# Patient Record
Sex: Female | Born: 1990 | Race: Black or African American | Hispanic: No | Marital: Single | Smoking: Former smoker
Health system: Southern US, Community
[De-identification: ages and names within clinical notes are randomized; demographics above are authoritative.]

## PROBLEM LIST (undated history)

## (undated) DIAGNOSIS — Z789 Other specified health status: Secondary | ICD-10-CM

## (undated) DIAGNOSIS — IMO0002 Reserved for concepts with insufficient information to code with codable children: Secondary | ICD-10-CM

## (undated) DIAGNOSIS — R87619 Unspecified abnormal cytological findings in specimens from cervix uteri: Secondary | ICD-10-CM

## (undated) DIAGNOSIS — N390 Urinary tract infection, site not specified: Secondary | ICD-10-CM

## (undated) HISTORY — DX: Urinary tract infection, site not specified: N39.0

## (undated) HISTORY — DX: Reserved for concepts with insufficient information to code with codable children: IMO0002

## (undated) HISTORY — DX: Unspecified abnormal cytological findings in specimens from cervix uteri: R87.619

## (undated) HISTORY — PX: DENTAL SURGERY: SHX609

---
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
## 2010-02-11 ENCOUNTER — Emergency Department (HOSPITAL_COMMUNITY): Admission: EM | Admit: 2010-02-11 | Discharge: 2010-02-11 | Payer: Self-pay | Admitting: Emergency Medicine

## 2010-08-21 ENCOUNTER — Emergency Department (HOSPITAL_COMMUNITY)
Admission: EM | Admit: 2010-08-21 | Discharge: 2010-08-21 | Payer: Self-pay | Source: Home / Self Care | Admitting: Family Medicine

## 2010-08-23 ENCOUNTER — Emergency Department (HOSPITAL_COMMUNITY)
Admission: EM | Admit: 2010-08-23 | Discharge: 2010-08-23 | Payer: Self-pay | Source: Home / Self Care | Admitting: Family Medicine

## 2010-08-24 LAB — CULTURE, ROUTINE-ABSCESS

## 2010-09-28 LAB — GC/CHLAMYDIA PROBE AMP, GENITAL
Chlamydia: NEGATIVE
Gonorrhea: NEGATIVE

## 2010-09-28 LAB — ABO/RH

## 2010-10-14 LAB — URINE MICROSCOPIC-ADD ON

## 2010-10-14 LAB — URINALYSIS, ROUTINE W REFLEX MICROSCOPIC
Bilirubin Urine: NEGATIVE
Glucose, UA: NEGATIVE mg/dL
Ketones, ur: NEGATIVE mg/dL
Nitrite: NEGATIVE
Protein, ur: 30 mg/dL — AB
Specific Gravity, Urine: 1.022 (ref 1.005–1.030)
Urobilinogen, UA: 1 mg/dL (ref 0.0–1.0)
pH: 6.5 (ref 5.0–8.0)

## 2010-10-14 LAB — PREGNANCY, URINE: Preg Test, Ur: NEGATIVE

## 2010-10-21 ENCOUNTER — Emergency Department (HOSPITAL_COMMUNITY)
Admission: EM | Admit: 2010-10-21 | Discharge: 2010-10-21 | Disposition: A | Discharge status: Home / Self Care | Payer: Medicaid Other | Attending: Emergency Medicine | Admitting: Emergency Medicine

## 2010-10-21 DIAGNOSIS — O9989 Other specified diseases and conditions complicating pregnancy, childbirth and the puerperium: Secondary | ICD-10-CM | POA: Insufficient documentation

## 2010-10-21 DIAGNOSIS — T7840XA Allergy, unspecified, initial encounter: Secondary | ICD-10-CM | POA: Insufficient documentation

## 2010-10-21 DIAGNOSIS — O99891 Other specified diseases and conditions complicating pregnancy: Secondary | ICD-10-CM | POA: Insufficient documentation

## 2010-10-21 DIAGNOSIS — I10 Essential (primary) hypertension: Secondary | ICD-10-CM | POA: Insufficient documentation

## 2010-10-21 DIAGNOSIS — L299 Pruritus, unspecified: Secondary | ICD-10-CM | POA: Insufficient documentation

## 2011-02-22 LAB — STREP B DNA PROBE: GBS: NEGATIVE

## 2011-03-15 ENCOUNTER — Inpatient Hospital Stay (HOSPITAL_COMMUNITY)
Admission: AD | Admit: 2011-03-15 | Discharge: 2011-03-16 | Disposition: A | Discharge status: Home / Self Care | Payer: Medicaid Other | Source: Ambulatory Visit | Attending: Obstetrics and Gynecology | Admitting: Obstetrics and Gynecology

## 2011-03-15 DIAGNOSIS — R109 Unspecified abdominal pain: Secondary | ICD-10-CM | POA: Insufficient documentation

## 2011-03-15 DIAGNOSIS — O99891 Other specified diseases and conditions complicating pregnancy: Secondary | ICD-10-CM | POA: Insufficient documentation

## 2011-03-15 HISTORY — DX: Other specified health status: Z78.9

## 2011-03-16 ENCOUNTER — Encounter (HOSPITAL_COMMUNITY): Payer: Self-pay | Admitting: *Deleted

## 2011-03-16 LAB — URINALYSIS, ROUTINE W REFLEX MICROSCOPIC
Bilirubin Urine: NEGATIVE
Glucose, UA: NEGATIVE mg/dL
Ketones, ur: NEGATIVE mg/dL
Leukocytes, UA: NEGATIVE
pH: 6 (ref 5.0–8.0)

## 2011-03-16 NOTE — Progress Notes (Signed)
Pt presents to [facility] for c/o pain on left side that started after she left the md office around 430pm yesterday evening.  States she was 1cm/60. Denies leaking or bleeding.

## 2011-03-16 NOTE — Progress Notes (Signed)
Dr. Ambrose Mantle notified of pt presenting for c/o abdominal pain mostly on left side.  Notified of pt placed on monitor and has a reactive strip with irregular contractions.  No cervical change since checked by md yesterday. Ok to [employer] home.

## 2011-03-16 NOTE — Progress Notes (Signed)
WAS  AT DR OFFICE - NL VISIT- R SIDE WAS HURTING- THEN BACK  AND NOW  L SIDE HURTS.  CALLED DR HENLEY.  DENIES HSV AND MRSA. DOES NOT  HURT TO VOID. LAST SEX- LAST WEEK.

## 2011-03-23 ENCOUNTER — Encounter (HOSPITAL_COMMUNITY): Payer: Self-pay | Admitting: *Deleted

## 2011-03-23 ENCOUNTER — Telehealth (HOSPITAL_COMMUNITY): Payer: Self-pay | Admitting: *Deleted

## 2011-03-23 NOTE — Telephone Encounter (Signed)
Preadmission screen  

## 2011-03-27 ENCOUNTER — Encounter (HOSPITAL_COMMUNITY): Payer: Self-pay

## 2011-03-27 ENCOUNTER — Inpatient Hospital Stay (HOSPITAL_COMMUNITY)
Admission: RE | Admit: 2011-03-27 | Discharge: 2011-03-29 | DRG: 775 | Disposition: A | Discharge status: Home / Self Care | Payer: Medicaid Other | Source: Ambulatory Visit | Attending: Obstetrics and Gynecology | Admitting: Obstetrics and Gynecology

## 2011-03-27 ENCOUNTER — Inpatient Hospital Stay (HOSPITAL_COMMUNITY): Payer: Medicaid Other | Admitting: Anesthesiology

## 2011-03-27 ENCOUNTER — Encounter (HOSPITAL_COMMUNITY): Payer: Self-pay | Admitting: Anesthesiology

## 2011-03-27 LAB — CBC
HCT: 33.9 % — ABNORMAL LOW (ref 36.0–46.0)
Hemoglobin: 11.2 g/dL — ABNORMAL LOW (ref 12.0–15.0)
MCH: 32.1 pg (ref 26.0–34.0)
MCHC: 33 g/dL (ref 30.0–36.0)
RDW: 13 % (ref 11.5–15.5)

## 2011-03-27 LAB — ABO/RH: ABO/RH(D): O POS

## 2011-03-27 LAB — RPR: RPR Ser Ql: NONREACTIVE

## 2011-03-27 MED ORDER — LACTATED RINGERS IV SOLN: 500.0000 mL | INTRAVENOUS | Status: DC | PRN

## 2011-03-27 MED ORDER — BENZOCAINE-MENTHOL 20-0.5 % EX AERO
1.0000 "application " | INHALATION_SPRAY | CUTANEOUS | Status: DC | PRN
  Administered 2011-03-27: 1 via TOPICAL

## 2011-03-27 MED ORDER — ONDANSETRON HCL 4 MG PO TABS: 4.0000 mg | ORAL_TABLET | ORAL | Status: DC | PRN

## 2011-03-27 MED ORDER — FENTANYL 2.5 MCG/ML BUPIVACAINE 1/10 % EPIDURAL INFUSION (WH - ANES)
14.0000 mL/h | INTRAMUSCULAR | Status: DC
  Administered 2011-03-27: 14 mL/h via EPIDURAL
  Filled 2011-03-27: qty 60

## 2011-03-27 MED ORDER — OXYCODONE-ACETAMINOPHEN 5-325 MG PO TABS
1.0000 | ORAL_TABLET | ORAL | Status: DC | PRN
  Administered 2011-03-27 – 2011-03-29 (×4): 1 via ORAL
  Filled 2011-03-27 (×4): qty 1

## 2011-03-27 MED ORDER — OXYCODONE-ACETAMINOPHEN 5-325 MG PO TABS: 2.0000 | ORAL_TABLET | ORAL | Status: DC | PRN

## 2011-03-27 MED ORDER — DIPHENHYDRAMINE HCL 50 MG/ML IJ SOLN: 12.5000 mg | INTRAMUSCULAR | Status: DC | PRN

## 2011-03-27 MED ORDER — EPHEDRINE 5 MG/ML INJ: 10.0000 mg | INTRAVENOUS | Status: DC | PRN

## 2011-03-27 MED ORDER — LACTATED RINGERS IV SOLN
500.0000 mL | Freq: Once | INTRAVENOUS | Status: AC
  Administered 2011-03-27: 1000 mL via INTRAVENOUS

## 2011-03-27 MED ORDER — PHENYLEPHRINE 40 MCG/ML (10ML) SYRINGE FOR IV PUSH (FOR BLOOD PRESSURE SUPPORT)
80.0000 ug | PREFILLED_SYRINGE | INTRAVENOUS | Status: DC | PRN
  Filled 2011-03-27: qty 5

## 2011-03-27 MED ORDER — OXYTOCIN 20 UNITS IN LACTATED RINGERS INFUSION - SIMPLE
125.0000 mL/h | INTRAVENOUS | Status: AC
  Filled 2011-03-27: qty 1000

## 2011-03-27 MED ORDER — PHENYLEPHRINE 40 MCG/ML (10ML) SYRINGE FOR IV PUSH (FOR BLOOD PRESSURE SUPPORT): 80.0000 ug | PREFILLED_SYRINGE | INTRAVENOUS | Status: DC | PRN

## 2011-03-27 MED ORDER — IBUPROFEN 600 MG PO TABS
600.0000 mg | ORAL_TABLET | Freq: Four times a day (QID) | ORAL | Status: DC
  Administered 2011-03-27 – 2011-03-29 (×7): 600 mg via ORAL
  Filled 2011-03-27 (×7): qty 1

## 2011-03-27 MED ORDER — CLINDAMYCIN PHOSPHATE 900 MG/50ML IV SOLN: 900.0000 mg | Freq: Three times a day (TID) | INTRAVENOUS | Status: DC

## 2011-03-27 MED ORDER — ONDANSETRON HCL 4 MG/2ML IJ SOLN: 4.0000 mg | INTRAMUSCULAR | Status: DC | PRN

## 2011-03-27 MED ORDER — MEASLES, MUMPS & RUBELLA VAC ~~LOC~~ INJ
0.5000 mL | INJECTION | Freq: Once | SUBCUTANEOUS | Status: DC
  Filled 2011-03-27: qty 0.5

## 2011-03-27 MED ORDER — DIBUCAINE 1 % RE OINT: 1.0000 "application " | TOPICAL_OINTMENT | RECTAL | Status: DC | PRN

## 2011-03-27 MED ORDER — DIPHENHYDRAMINE HCL 25 MG PO CAPS: 25.0000 mg | ORAL_CAPSULE | Freq: Four times a day (QID) | ORAL | Status: DC | PRN

## 2011-03-27 MED ORDER — TETANUS-DIPHTH-ACELL PERTUSSIS 5-2.5-18.5 LF-MCG/0.5 IM SUSP
0.5000 mL | Freq: Once | INTRAMUSCULAR | Status: AC
  Administered 2011-03-28: 0.5 mL via INTRAMUSCULAR
  Filled 2011-03-27: qty 0.5

## 2011-03-27 MED ORDER — OXYTOCIN 20 UNITS IN LACTATED RINGERS INFUSION - SIMPLE
1.0000 m[IU]/min | INTRAVENOUS | Status: DC
  Administered 2011-03-27: 333 m[IU]/min via INTRAVENOUS
  Administered 2011-03-27: 1 m[IU]/min via INTRAVENOUS

## 2011-03-27 MED ORDER — LIDOCAINE HCL 1.5 % IJ SOLN
INTRAMUSCULAR | Status: DC | PRN
  Administered 2011-03-27 (×2): 5 mL via EPIDURAL
  Administered 2011-03-27: 2 mL via EPIDURAL

## 2011-03-27 MED ORDER — LACTATED RINGERS IV SOLN: INTRAVENOUS | Status: DC

## 2011-03-27 MED ORDER — ZOLPIDEM TARTRATE 5 MG PO TABS: 5.0000 mg | ORAL_TABLET | Freq: Every evening | ORAL | Status: DC | PRN

## 2011-03-27 MED ORDER — OXYTOCIN BOLUS FROM INFUSION
500.0000 mL | Freq: Once | INTRAVENOUS | Status: DC
  Filled 2011-03-27: qty 500
  Filled 2011-03-27: qty 1000

## 2011-03-27 MED ORDER — IBUPROFEN 600 MG PO TABS: 600.0000 mg | ORAL_TABLET | Freq: Four times a day (QID) | ORAL | Status: DC | PRN

## 2011-03-27 MED ORDER — PENICILLIN G POTASSIUM 5000000 UNITS IJ SOLR: 2.5000 10*6.[IU] | INTRAVENOUS | Status: DC

## 2011-03-27 MED ORDER — PENICILLIN G POTASSIUM 5000000 UNITS IJ SOLR: 5.0000 10*6.[IU] | Freq: Once | INTRAVENOUS | Status: DC

## 2011-03-27 MED ORDER — LANOLIN HYDROUS EX OINT: TOPICAL_OINTMENT | CUTANEOUS | Status: DC | PRN

## 2011-03-27 MED ORDER — SENNOSIDES-DOCUSATE SODIUM 8.6-50 MG PO TABS
2.0000 | ORAL_TABLET | Freq: Every day | ORAL | Status: DC
  Administered 2011-03-27 – 2011-03-29 (×2): 2 via ORAL

## 2011-03-27 MED ORDER — PRENATAL PLUS 27-1 MG PO TABS
1.0000 | ORAL_TABLET | Freq: Every day | ORAL | Status: DC
  Administered 2011-03-28: 1 via ORAL
  Filled 2011-03-27: qty 1

## 2011-03-27 MED ORDER — SIMETHICONE 80 MG PO CHEW: 80.0000 mg | CHEWABLE_TABLET | ORAL | Status: DC | PRN

## 2011-03-27 MED ORDER — EPHEDRINE 5 MG/ML INJ
10.0000 mg | INTRAVENOUS | Status: DC | PRN
  Filled 2011-03-27: qty 4

## 2011-03-27 MED ORDER — BENZOCAINE-MENTHOL 20-0.5 % EX AERO
INHALATION_SPRAY | CUTANEOUS | Status: AC
  Administered 2011-03-27: 1 via TOPICAL
  Filled 2011-03-27: qty 56

## 2011-03-27 MED ORDER — LIDOCAINE HCL (PF) 1 % IJ SOLN
30.0000 mL | INTRAMUSCULAR | Status: DC | PRN
  Filled 2011-03-27: qty 30

## 2011-03-27 MED ORDER — WITCH HAZEL-GLYCERIN EX PADS: 1.0000 "application " | MEDICATED_PAD | CUTANEOUS | Status: DC | PRN

## 2011-03-27 NOTE — Op Note (Signed)
NAMEINESSA, WARDROP             ACCOUNT NO.:  [id number]  MEDICAL RECORD NO.:  [id number]  LOCATION:  9121                          FACILITY:  [facility]  PHYSICIAN:  Malachi Pro. Ambrose Mantle, M.D. DATE OF BIRTH:  Jun 05, 1991  DATE OF PROCEDURE:  03/27/2011 DATE OF DISCHARGE:                              OPERATIVE REPORT   This patient reached full dilatation and was asked to begin pushing. She was placed in lithotomy position in the stirrups and gradually brought the vertex to the perineum in the ROT position.  It gradually rotated to ROA, and she delivered spontaneously ROA over an intact perineum a living 6 pounds 4 ounces female infant without Apgars of 8 at 1 and 9 at 5 minutes.  There was one loop of nuchal cord that was loose. There was a mild amount of shoulder dystocia that I managed by putting the patient's legs in McRoberts position grasping under the axilla. After the delivery, the cord was clamped, and the infant was placed in the warmer after placing the baby on the mother's abdomen.  The placenta was removed intact.  The inside of the uterus was inspected and found to be free of any remaining products of conception.  The uterus contracted well with the aid of massage and Pitocin.  There was one small bleeder at the introitus at 10 o'clock and this was sutured with a figure-of- eight suture of 3-0 Vicryl with good control of bleeding there were no complications.  Blood loss was estimated about 400 mL.  The patient was allowed to bond with her baby.     Malachi Pro. Ambrose Mantle, M.D.     TFH/MEDQ  D:  03/27/2011  T:  03/27/2011  Job:  [id number]

## 2011-03-27 NOTE — Progress Notes (Signed)
Dr. Ambrose Mantle notified of pt status, SVE, FHR, UC pattern, pitocin amount, and pt's epidural request.  Orders received for an epidural, will continue to monitor.

## 2011-03-27 NOTE — Progress Notes (Signed)
Dr. Ambrose Mantle at bedside for delivery and reviewing FHR tracing.

## 2011-03-27 NOTE — Anesthesia Procedure Notes (Signed)
Epidural Patient location during procedure: OB Start time: 03/27/2011 12:12 PM Reason for block: procedure for pain  Staffing Performed by: anesthesiologist   Preanesthetic Checklist Completed: patient identified, site marked, surgical consent, pre-op evaluation, timeout performed, IV checked, risks and benefits discussed and monitors and equipment checked  Epidural Patient position: sitting Prep: site prepped and draped and DuraPrep Patient monitoring: continuous pulse ox and blood pressure Approach: midline Injection technique: LOR air  Needle:  Needle type: Tuohy  Needle gauge: 17 G Needle length: 9 cm Needle insertion depth: 5 cm cm Catheter type: closed end flexible Catheter size: 19 Gauge Catheter at skin depth: 10 cm Test dose: negative  Assessment Events: blood not aspirated, injection not painful, no injection resistance, negative IV test and no paresthesia  Additional Notes Discussed risk of headache, infection, bleeding, nerve injury and failed or incomplete block.  Patient voices understanding and wishes to proceed.

## 2011-03-27 NOTE — H&P (Signed)
[name], [name]             ACCOUNT NO.:  [id number]  MEDICAL RECORD NO.:  [id number]  LOCATION:  9174                          [facility]ACILITY:  [facility]  PHYSICIAN:  [name], M.D. DATE OF BIRTH:  1990/08/05  DATE OF ADMISSION:  03/27/2011 DATE OF DISCHARGE:                             HISTORY & PHYSICAL   PRESENT ILLNESS:  This is a 20 year old black female para 0-0-1-0, gravida 2, EDC of March 28, 2011, admitted for induction of labor. Blood group and type O+, negative antibody, nonreactive serology, rubella immune.  Urine culture negative.  Hepatitis B surface antigen negative.  HIV negative. GC and Chlamydia negative.  Hemoglobin electrophoresis AA first trimester screen and AFP negative, 1-hour Glucola was 74.  Group B strep negative.  Repeat GC and Chlamydia negative.  This patient had an ultrasound on September 13, 2010, 12 weeks and 0 days, [facility] March 28, 2011.  This was confirmed by later ultrasound and an ultrasound at 23 weeks completed her anatomical survey.  She has had an uncomplicated prenatal course except for one UTI and is admitted now for induction of labor.  PAST MEDICAL HISTORY:  No known allergies.  OPERATIONS:  Wisdom teeth extraction in 2006.  ILLNESSES:  Frequent UTIs.  Alcohol, tobacco, and drugs none.  The patient quit smoking before pregnancy.  OBSTETRICAL HISTORY:  A spontaneous abortion at 8 weeks and 2 days on May 22, 2010.  FAMILY HISTORY:  Maternal grandmother has heart disease and the mother has high blood pressure.  PHYSICAL EXAMINATION:  GENERAL:  Well-developed, well-nourished black female in no distress. VITAL SIGNS:  Blood pressure is 118/78, pulse is 117, respirations 16, temperature 98.8. HEART:  Normal, sinus tachycardia.  No murmurs. LUNGS:  Clear to auscultation. ABDOMEN:  Soft.  Fundal height 38 cm on March 23, 2011.  The cervix was a fingertip, 60% vertex at -2 station on March 23, 2011.  IMPRESSION:   Intrauterine pregnancy at 39 weeks and 6 days.  The patient will begin on Pitocin and progress of labor will be monitored.     [name], M.D.     TFH/MEDQ  D:  03/27/2011  T:  03/27/2011  Job:  [id number]

## 2011-03-27 NOTE — Anesthesia Preprocedure Evaluation (Signed)
Anesthesia Evaluation  Name, MR# and DOB Patient awake  General Assessment Comment  Reviewed: Allergy & Precautions, H&P , NPO status , Patient's Chart, lab work & pertinent test results, reviewed documented beta blocker date and time   History of Anesthesia Complications Negative for: history of anesthetic complications  Airway Mallampati: II TM Distance: >3 FB Neck ROM: full    Dental  (+) Teeth Intact   Pulmonary  clear to auscultation  breath sounds clear to auscultation none    Cardiovascular regular Normal    Neuro/Psych Negative Neurological ROS  Negative Psych ROS  GI/Hepatic/Renal negative GI ROS  negative Liver ROS  negative Renal ROS        Endo/Other  Negative Endocrine ROS (+)      Abdominal   Musculoskeletal   Hematology negative hematology ROS (+)   Peds  Reproductive/Obstetrics (+) Pregnancy    Anesthesia Other Findings Tongue piercing - asked to remove            Anesthesia Physical Anesthesia Plan  ASA: II  Anesthesia Plan: Epidural   Post-op Pain Management:    Induction:   Airway Management Planned:   Additional Equipment:   Intra-op Plan:   Post-operative Plan:   Informed Consent: I have reviewed the patients History and Physical, chart, labs and discussed the procedure including the risks, benefits and alternatives for the proposed anesthesia with the patient or authorized representative who has indicated his/her understanding and acceptance.     Plan Discussed with:   Anesthesia Plan Comments:         Anesthesia Quick Evaluation

## 2011-03-27 NOTE — Progress Notes (Signed)
Dr. Ambrose Mantle at bedside and notified of pt status, SVE, FHR, UC pattern, pitocin amount, and epidural placement.  Will continue to monitor.

## 2011-03-27 NOTE — Progress Notes (Signed)
Pt has received her epidural and is comfortable.. The nurse examined her recently and the cervix was 5 cm 90-100% effaced and the vertex was at -1 to 0 station. The FHR tracing is normal.

## 2011-03-27 NOTE — Consult Note (Signed)
Lots of breastfeeding teaching for mom, FOB, and grandparents.  Harmony given with instructions for home use if needed.  Kelli Hope sleepy for this feeding requiring frequent stimulation to keep sucking for 10 minutes.  Mom can easily hand express plenty of colostrum.

## 2011-03-27 NOTE — Progress Notes (Signed)
  Pt is contracting spontaneously at 3-4 minute intervals. The cervix is 3 cm 70% effaced and the vertex is at - 2 station.AROM produced clear fluid.

## 2011-03-27 NOTE — Progress Notes (Signed)
NSVD female, by Dr. Ambrose Mantle, apgars, 8,9.

## 2011-03-28 LAB — CBC
Hemoglobin: 9.6 g/dL — ABNORMAL LOW (ref 12.0–15.0)
MCHC: 33.7 g/dL (ref 30.0–36.0)

## 2011-03-28 NOTE — Anesthesia Postprocedure Evaluation (Signed)
  Anesthesia Post-op Note  Patient: [name]  Procedure(s) Performed: * No procedures listed *  Patient Location: Mother/Baby  Anesthesia Type: Epidural  Level of Consciousness: awake, alert  and oriented  Airway and Oxygen Therapy: Patient Spontanous Breathing  Post-op Pain: none  Post-op Assessment: Post-op Vital signs reviewed and Patient's Cardiovascular Status Stable  Post-op Vital Signs: Reviewed and stable  Complications: No apparent anesthesia complications

## 2011-03-28 NOTE — Progress Notes (Signed)
#  1 afebrile, BP normal, HGB 11.2 to 9.6. Baby doing well.

## 2011-03-28 NOTE — Progress Notes (Signed)
UR chart review completed.  

## 2011-03-29 NOTE — Discharge Summary (Signed)
[name], [name]             ACCOUNT NO.:  [id number]  MEDICAL RECORD NO.:  [id number]  LOCATION:  9121                          [facility]ACILITY:  [facility]  PHYSICIAN:  Malachi Pro. Ambrose Mantle, M.D. DATE OF BIRTH:  [birth year]  DATE OF ADMISSION:  03/27/2011 DATE OF DISCHARGE:  03/29/2011                              DISCHARGE SUMMARY   A 20 year old black female, para 0-0-1-0, gravida 2, admitted for induction of labor.  Blood group and type O+.  Negative antibody, nonreactive serology, rubella immune.  Urine culture negative. Hepatitis B surface antigen negative.  HIV negative, GC and Chlamydia negative.  Hemoglobin electrophoresis AA.  First trimester screen and AFP negative.  One-hour Glucola 74.  Group B strep negative.  GC and Chlamydia negative.  The patient was admitted for induction of labor. She was contracting actually on admission at 3-4 minute intervals.  The cervix was 3 cm, 70%.  Artificial rupture of the membranes produced clear fluid at 8:43 a.m.  At 1:19 p.m., the patient had received her epidural and was comfortable.  Cervix had been 5 cm, 90-100% effaced just before my evaluation.  The patient reached full dilatation, was asked to begin pushing, placed in lithotomy position.  Vertex was ROT and gradually rotated ROA and she delivered spontaneously ROA over an intact perineum a living 6 pounds 4 ounces female infant with Apgars of 8 at 1 and 9 at 5 minutes.  There was a loose loop of nuchal cord, mild shoulder dystocia, was managed with McRoberts maneuver and grasping under the axilla of the baby.  The placenta delivered intact.  There was one bleeder at the introitus at 10 o'clock, it was sutured.  Blood loss about 400 mL.  Postpartum the patient did very well and was discharged on the second postpartum day.  Initial hemoglobin was 11.2, hematocrit 33.9, white count 9200, platelet count 257,000.  RPR was nonreactive. MSRA by PCR was negative.  Follow-up hemoglobin was  9.6.  FINAL DIAGNOSES:  Intrauterine pregnancy at 39 weeks 6 days, delivered ROA.  OPERATION:  Spontaneous delivery ROA, repair of a small vaginal laceration at the introitus at 10 o'clock.  FINAL CONDITION:  Improved.  INSTRUCTIONS:  Include our regular discharge instruction booklet.  The patient declines analgesics and is asked to return to the office in 6 weeks for follow-up examination.     Malachi Pro. Ambrose Mantle, M.D.     TFH/MEDQ  D:  03/29/2011  T:  03/29/2011  Job:  [id number]

## 2011-03-29 NOTE — Progress Notes (Signed)
#  2 afebrile doing well for D/C Declines analgesics

## 2012-12-31 ENCOUNTER — Encounter (HOSPITAL_COMMUNITY): Payer: Self-pay | Admitting: Emergency Medicine

## 2012-12-31 ENCOUNTER — Emergency Department (HOSPITAL_COMMUNITY)
Admission: EM | Admit: 2012-12-31 | Discharge: 2012-12-31 | Disposition: A | Discharge status: Home / Self Care | Payer: PRIVATE HEALTH INSURANCE | Attending: Emergency Medicine | Admitting: Emergency Medicine

## 2012-12-31 ENCOUNTER — Emergency Department (HOSPITAL_COMMUNITY): Payer: PRIVATE HEALTH INSURANCE

## 2012-12-31 DIAGNOSIS — F17200 Nicotine dependence, unspecified, uncomplicated: Secondary | ICD-10-CM | POA: Insufficient documentation

## 2012-12-31 DIAGNOSIS — Z3202 Encounter for pregnancy test, result negative: Secondary | ICD-10-CM | POA: Insufficient documentation

## 2012-12-31 DIAGNOSIS — R112 Nausea with vomiting, unspecified: Secondary | ICD-10-CM | POA: Insufficient documentation

## 2012-12-31 DIAGNOSIS — K5289 Other specified noninfective gastroenteritis and colitis: Secondary | ICD-10-CM | POA: Insufficient documentation

## 2012-12-31 DIAGNOSIS — R197 Diarrhea, unspecified: Secondary | ICD-10-CM | POA: Insufficient documentation

## 2012-12-31 DIAGNOSIS — K529 Noninfective gastroenteritis and colitis, unspecified: Secondary | ICD-10-CM

## 2012-12-31 DIAGNOSIS — Z87440 Personal history of urinary (tract) infections: Secondary | ICD-10-CM | POA: Insufficient documentation

## 2012-12-31 LAB — CBC WITH DIFFERENTIAL/PLATELET
Eosinophils Absolute: 0.1 10*3/uL (ref 0.0–0.7)
Eosinophils Relative: 3 % (ref 0–5)
Hemoglobin: 12.7 g/dL (ref 12.0–15.0)
Lymphocytes Relative: 33 % (ref 12–46)
Lymphs Abs: 1.6 10*3/uL (ref 0.7–4.0)
MCH: 31.9 pg (ref 26.0–34.0)
MCV: 95.5 fL (ref 78.0–100.0)
Monocytes Relative: 8 % (ref 3–12)
RBC: 3.98 MIL/uL (ref 3.87–5.11)

## 2012-12-31 LAB — URINALYSIS, ROUTINE W REFLEX MICROSCOPIC
Glucose, UA: NEGATIVE mg/dL
Leukocytes, UA: NEGATIVE
Nitrite: NEGATIVE
Protein, ur: NEGATIVE mg/dL
Urobilinogen, UA: 1 mg/dL (ref 0.0–1.0)

## 2012-12-31 LAB — COMPREHENSIVE METABOLIC PANEL
AST: 16 U/L (ref 0–37)
Albumin: 3.7 g/dL (ref 3.5–5.2)
Calcium: 9.1 mg/dL (ref 8.4–10.5)
Chloride: 106 mEq/L (ref 96–112)
Creatinine, Ser: 0.79 mg/dL (ref 0.50–1.10)
Total Protein: 7 g/dL (ref 6.0–8.3)

## 2012-12-31 LAB — PREGNANCY, URINE: Preg Test, Ur: NEGATIVE

## 2012-12-31 MED ORDER — ONDANSETRON HCL 4 MG PO TABS: 4.0000 mg | ORAL_TABLET | Freq: Four times a day (QID) | ORAL | Status: DC

## 2012-12-31 MED ORDER — SODIUM CHLORIDE 0.9 % IV SOLN
Freq: Once | INTRAVENOUS | Status: AC
  Administered 2012-12-31: 13:00:00 via INTRAVENOUS

## 2012-12-31 MED ORDER — ACETAMINOPHEN 325 MG PO TABS
650.0000 mg | ORAL_TABLET | Freq: Once | ORAL | Status: AC
  Administered 2012-12-31: 650 mg via ORAL
  Filled 2012-12-31: qty 2

## 2012-12-31 NOTE — ED Notes (Signed)
Per pt she started w/ n/v/d and abd pain  Since monday

## 2012-12-31 NOTE — ED Notes (Signed)
Pt stated she began having abd pain, n/v/d that started on Monday. Pt states she is no longer having nausea but still is having diarrhea. Pt rates pain 7/10, pt describes pain as bad cramping.

## 2012-12-31 NOTE — ED Provider Notes (Signed)
History     CSN: [id number]  Arrival date & time 12/31/12  1138   First MD Initiated Contact with Patient 12/31/12 1146      Chief Complaint  Patient presents with  . Abdominal Pain  . Emesis    (Consider location/radiation/quality/duration/timing/severity/associated sxs/prior treatment) HPI  Patient is a 22 year old female presenting to the emergency department for 3 days of nausea, nonbilious nonbloody vomiting, nonbloody diarrhea, and waxing and waning cramping upper abdominal pain without radiation. Patient rates pain at worst 7/10. Patient rates pain 3/10 at this time. Patient states nausea and vomiting have resolved as of yesterday but is still having a few episodes of diarrhea. Patient states she is able to tolerate by mouth liquids starting yesterday. Denies any fevers, chills, urinary symptoms, pelvic symptoms, abdominal or pelvic surgeries.  Past Medical History  Diagnosis Date  . Frequent UTI   . No pertinent past medical history   . Abnormal Pap smear     Past Surgical History  Procedure Laterality Date  . Dental surgery      Family History  Problem Relation Age of Onset  . Hypertension Mother   . Heart disease Maternal Grandmother     History  Substance Use Topics  . Smoking status: Current Every Day Smoker    Last Attempt to Quit: 07/22/2010  . Smokeless tobacco: Not on file  . Alcohol Use: No    OB History   Grav Para Term Preterm Abortions TAB SAB Ect Mult Living   2 1 1  1  1   1       Review of Systems  Constitutional: Negative for fever and chills.  HENT: Negative.   Eyes: Negative.   Respiratory: Negative for shortness of breath.   Cardiovascular: Negative for chest pain.  Gastrointestinal: Positive for nausea, vomiting and abdominal pain. Negative for blood in stool and anal bleeding.  Genitourinary: Negative.   Musculoskeletal: Negative.   Skin: Negative.   Neurological: Negative.     Allergies  Review of patient's allergies  indicates no known allergies.  Home Medications   Current Outpatient Rx  Name  Route  Sig  Dispense  Refill  . etonogestrel (IMPLANON) 68 MG IMPL implant   Subcutaneous   Inject 1 each into the skin once.         Marland Kitchen ibuprofen (ADVIL,MOTRIN) 200 MG tablet   Oral   Take 800 mg by mouth every 6 (six) hours as needed for pain.         Marland Kitchen ondansetron (ZOFRAN) 4 MG tablet   Oral   Take 1 tablet (4 mg total) by mouth every 6 (six) hours.   12 tablet   0     BP 101/61  Pulse 65  Temp(Src) 98.4 F (36.9 C)  Resp 16  SpO2 100%  Physical Exam  Constitutional: She is oriented to person, place, and time. She appears well-developed and well-nourished. No distress.  HENT:  Head: Normocephalic and atraumatic.  Mouth/Throat: Oropharynx is clear and moist.  Eyes: Conjunctivae are normal.  Neck: Normal range of motion. Neck supple.  Cardiovascular: Normal rate, regular rhythm and normal heart sounds.   Pulmonary/Chest: Effort normal and breath sounds normal.  Abdominal: Soft. Bowel sounds are normal. She exhibits no distension and no mass. There is tenderness in the epigastric area and periumbilical area. There is no rebound and no guarding.  Musculoskeletal: She exhibits no edema.  Lymphadenopathy:    She has no cervical adenopathy.  Neurological: She is alert and  oriented to person, place, and time.  Skin: Skin is warm and dry. She is not diaphoretic.    ED Course  Procedures (including critical care time)  Labs Reviewed  URINALYSIS, ROUTINE W REFLEX MICROSCOPIC  PREGNANCY, URINE  COMPREHENSIVE METABOLIC PANEL  CBC WITH DIFFERENTIAL  LIPASE, BLOOD   US Abdomen Complete  12/31/2012   *RADIOLOGY REPORT*  Clinical Data:  Abdominal pain.  COMPLETE ABDOMINAL ULTRASOUND  Comparison:  None.  Findings:  Gallbladder:  No gallstones, gallbladder wall thickening, or pericholecystic fluid.  Common bile duct:   Within normal limits in caliber.  Liver:  No focal lesion identified.  Within  normal limits in parenchymal echogenicity.  IVC:  Appears normal.  Pancreas:  No focal abnormality seen.  Spleen:  Within normal limits in size and echotexture.  Right Kidney:   Normal in size and parenchymal echogenicity.  No evidence of mass or hydronephrosis.  Left Kidney:  Normal in size and parenchymal echogenicity.  No evidence of mass or hydronephrosis.  Abdominal aorta:  No aneurysm identified.  IMPRESSION: Negative abdominal ultrasound.   Original Report Authenticated By: Charlett Nose, M.D.     1. Gastroenteritis       MDM  Patient is nontoxic, nonseptic appearing, in no apparent distress.  Symptoms consistent with viral gastroenteritis. Patient's pain and other symptoms adequately managed in emergency department.  Fluid bolus given.  Labs, imaging and vitals reviewed.  Patient does not meet the SIRS or Sepsis criteria.  On repeat exam patient does not have a surgical abdomen and there are nor peritoneal signs.  No indication of appendicitis, bowel obstruction, bowel perforation, cholecystitis, diverticulitis, or ectopic pregnancy.  Patient discharged home with symptomatic treatment and given strict instructions for follow-up with their primary care physician.  I have also discussed reasons to return immediately to the ER.  Patient expresses understanding and agrees with plan. Patient d/w with Dr. Rhunette Croft, agrees with plan. Patient is stable at time of discharge             Jeannetta Ellis, PA-C 12/31/12 2025

## 2013-01-01 NOTE — ED Provider Notes (Signed)
Medical screening examination/treatment/procedure(s) were performed by non-physician practitioner and as supervising physician I was immediately available for consultation/collaboration.  Derwood Kaplan, MD 01/01/13 2342

## 2013-03-18 ENCOUNTER — Emergency Department (HOSPITAL_COMMUNITY)
Admission: EM | Admit: 2013-03-18 | Discharge: 2013-03-18 | Disposition: A | Discharge status: Home / Self Care | Payer: PRIVATE HEALTH INSURANCE | Source: Home / Self Care | Attending: Family Medicine | Admitting: Family Medicine

## 2013-03-18 ENCOUNTER — Encounter (HOSPITAL_COMMUNITY): Payer: Self-pay | Admitting: Emergency Medicine

## 2013-03-18 DIAGNOSIS — IMO0002 Reserved for concepts with insufficient information to code with codable children: Secondary | ICD-10-CM

## 2013-03-18 DIAGNOSIS — L02411 Cutaneous abscess of right axilla: Secondary | ICD-10-CM

## 2013-03-18 DIAGNOSIS — L738 Other specified follicular disorders: Secondary | ICD-10-CM

## 2013-03-18 MED ORDER — DOXYCYCLINE HYCLATE 100 MG PO CAPS: 100.0000 mg | ORAL_CAPSULE | Freq: Two times a day (BID) | ORAL | Status: DC

## 2013-03-18 MED ORDER — IBUPROFEN 600 MG PO TABS: 600.0000 mg | ORAL_TABLET | Freq: Three times a day (TID) | ORAL | Status: DC | PRN

## 2013-03-18 MED ORDER — MUPIROCIN 2 % EX OINT: TOPICAL_OINTMENT | Freq: Three times a day (TID) | CUTANEOUS | Status: DC

## 2013-03-18 MED ORDER — CHLORHEXIDINE GLUCONATE 4 % EX LIQD: 60.0000 mL | Freq: Every day | CUTANEOUS | Status: DC | PRN

## 2013-03-18 NOTE — ED Notes (Signed)
Pt c/o multiple abscess under left axilla onset 1 week Sxs include: pain Denies: fevers, drainage... Has tried warm compressions w/no relief.  Alert w/no signs of acute distress.

## 2013-03-18 NOTE — ED Provider Notes (Signed)
CSN: [id number]     Arrival date & time 03/18/13  1113 History     First MD Initiated Contact with Patient 03/18/13 1136     Chief Complaint  Patient presents with  . Abscess   (Consider location/radiation/quality/duration/timing/severity/associated sxs/prior Treatment) HPI Comments: 22 year old smoker nondiabetic female here complaining of several boils in her right armpit for one week. No spontaneous drainage. One area has become more swollen and tender in the last 2 days. No fever or chills. 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 Currently using warm compressions.   Past Medical History  Diagnosis Date  . Frequent UTI   . No pertinent past medical history   . Abnormal Pap smear    Past Surgical History  Procedure Laterality Date  . Dental surgery     Family History  Problem Relation Age of Onset  . Hypertension Mother   . Heart disease Maternal Grandmother    History  Substance Use Topics  . Smoking status: Current Every Day Smoker    Last Attempt to Quit: 07/22/2010  . Smokeless tobacco: Not on file  . Alcohol Use: No   OB History   Grav Para Term Preterm Abortions TAB SAB Ect Mult Living   2 1 1  1  1   1      Review of Systems  Constitutional: Negative for fever and chills.  Gastrointestinal: Negative for nausea and vomiting.  Skin:       As per HPI  All other systems reviewed and are negative.    Allergies  Review of patient's allergies indicates no known allergies.  Home Medications   Current Outpatient Rx  Name  Route  Sig  Dispense  Refill  . etonogestrel (IMPLANON) 68 MG IMPL implant   Subcutaneous   Inject 1 each into the skin once.         . chlorhexidine (HIBICLENS) 4 % external liquid   Topical   Apply 60 mLs (4 application total) topically daily as needed.   120 mL   0   . doxycycline (VIBRAMYCIN) 100 MG capsule   Oral   Take 1 capsule (100 mg total) by mouth 2 (two) times daily.   20 capsule   0   .  ibuprofen (ADVIL,MOTRIN) 600 MG tablet   Oral   Take 1 tablet (600 mg total) by mouth every 8 (eight) hours as needed for pain.   30 tablet   0   . mupirocin ointment (BACTROBAN) 2 %   Topical   Apply topically 3 (three) times daily.   22 g   0    BP 115/75  Pulse 90  Temp(Src) 98.8 F (37.1 C) (Oral)  Resp 18  SpO2 100%  LMP 03/18/2013  Breastfeeding? No Physical Exam  Nursing note and vitals reviewed. Constitutional: She is oriented to person, place, and time. She appears well-developed and well-nourished. No distress.  HENT:  Head: Normocephalic and atraumatic.  Cardiovascular: Normal heart sounds.   Pulmonary/Chest: Breath sounds normal.  Neurological: She is alert and oriented to person, place, and time.  Skin: She is not diaphoretic.  Right axilla: There are several swollen erythematouse hair follicles, there is one with induration and fluctuation and size about 2 cm. No spontaneous drainage.     ED Course   INCISION AND DRAINAGE Date/Time: 03/18/2013 12:20 PM Performed by: Sharin Grave Authorized by: Sharin Grave Consent: Verbal consent obtained. Risks and benefits: risks, benefits and alternatives were discussed Patient  understanding: patient states understanding of the procedure being performed Patient consent: the patient's understanding of the procedure matches consent given Type: abscess Body area: upper extremity (right axilla) Anesthesia: local infiltration Local anesthetic: lidocaine 1% with epinephrine Anesthetic total: 2 ml Scalpel size: 11 Incision type: single straight Complexity: simple Drainage: purulent Drainage amount: scant Patient tolerance: Patient tolerated the procedure well with no immediate complications. Comments: Culture sample sent   (including critical care time)  Labs Reviewed - No data to display No results found. 1. Abscess of axilla, right   2. Infectious folliculitis     MDM  S/p I&D here today. Culture  pending.  The prescribed Hibiclens, doxycycline oral, ibuprofen and mupirocin ointment. Supportive care and red flags should prompt her return to medical attention discussed with patient and provided in writing.  Sharin Grave, MD 03/20/13 1610

## 2013-03-18 NOTE — Discharge Instructions (Signed)
Avoid shaving the affected area as dissection ibuprofen at risk for developing infection. Can do clipping. Can use prescribed antibacterial soap daily on told current infection resolves and then once a week for prevention. Take/use the prescribed medications as instructed. Return if worsening or no improving symptoms despite following treatment.

## 2013-03-21 LAB — CULTURE, ROUTINE-ABSCESS

## 2013-03-22 ENCOUNTER — Encounter (HOSPITAL_COMMUNITY): Payer: Self-pay | Admitting: Cardiology

## 2013-03-22 ENCOUNTER — Telehealth (HOSPITAL_COMMUNITY): Payer: Self-pay | Admitting: *Deleted

## 2013-03-22 ENCOUNTER — Emergency Department (HOSPITAL_COMMUNITY)
Admission: EM | Admit: 2013-03-22 | Discharge: 2013-03-22 | Disposition: A | Discharge status: Home / Self Care | Payer: PRIVATE HEALTH INSURANCE | Attending: Emergency Medicine | Admitting: Emergency Medicine

## 2013-03-22 DIAGNOSIS — IMO0002 Reserved for concepts with insufficient information to code with codable children: Secondary | ICD-10-CM | POA: Insufficient documentation

## 2013-03-22 DIAGNOSIS — L02411 Cutaneous abscess of right axilla: Secondary | ICD-10-CM

## 2013-03-22 DIAGNOSIS — Z8614 Personal history of Methicillin resistant Staphylococcus aureus infection: Secondary | ICD-10-CM | POA: Insufficient documentation

## 2013-03-22 DIAGNOSIS — F17200 Nicotine dependence, unspecified, uncomplicated: Secondary | ICD-10-CM | POA: Insufficient documentation

## 2013-03-22 DIAGNOSIS — Z87440 Personal history of urinary (tract) infections: Secondary | ICD-10-CM | POA: Insufficient documentation

## 2013-03-22 DIAGNOSIS — Z79899 Other long term (current) drug therapy: Secondary | ICD-10-CM | POA: Insufficient documentation

## 2013-03-22 NOTE — ED Notes (Signed)
Pt reports that she has a abscess under her right arm. States that she was seen a couple of days ago for the same. Given antibiotics but needs another place lanced.

## 2013-03-22 NOTE — ED Provider Notes (Signed)
CSN: [id number]     Arrival date & time 03/22/13  1625 History  This chart was scribed for non-physician practitioner Antony Madura, PA-C working with Shanna Cisco, MD by Danella Maiers, ED Scribe. This patient was seen in room TR07C/TR07C and the patient's care was started at 5:09 PM.    Chief Complaint  Patient presents with  . Abscess   Patient is a 22 y.o. female presenting with abscess. The history is provided by the patient. No language interpreter was used.  Abscess Location:  Shoulder/arm Shoulder/arm abscess location:  R axilla Red streaking: no   Duration:  10 days Progression:  Worsening Ineffective treatments:  Warm compresses Associated symptoms: no fever   Risk factors: hx of MRSA    HPI Comments: [name] is a 22 y.o. female with a history of MRSA who presents to the Emergency Department complaining of a constant, gradually worsening abscess under her right axilla onset 10 days ago. She went to urgent care Wednesday 03/19/11 for an I&D of a different abscess and was given doxycycline which she has been compliant with. She has been putting warm compresses on the affected area. She has a history of abscesses, with the last one being 2 1/2 years ago. She denies fever, tingling/numbness, and weakness in the upper extremities, and red streaking of the area. Pt has a history of MRSA.  Past Medical History  Diagnosis Date  . Frequent UTI   . No pertinent past medical history   . Abnormal Pap smear    Past Surgical History  Procedure Laterality Date  . Dental surgery     Family History  Problem Relation Age of Onset  . Hypertension Mother   . Heart disease Maternal Grandmother    History  Substance Use Topics  . Smoking status: Current Every Day Smoker    Last Attempt to Quit: 07/22/2010  . Smokeless tobacco: Not on file  . Alcohol Use: Yes   OB History   Grav Para Term Preterm Abortions TAB SAB Ect Mult Living   2 1 1  1  1   1      Review of Systems   Constitutional: Negative for fever.  Skin: Positive for wound (abscess to right axilla).  Neurological: Negative for weakness and numbness.  All other systems reviewed and are negative.   Allergies  Review of patient's allergies indicates no known allergies.  Home Medications   Current Outpatient Rx  Name  Route  Sig  Dispense  Refill  . chlorhexidine (HIBICLENS) 4 % external liquid   Topical   Apply 60 mLs (4 application total) topically daily as needed.   120 mL   0   . doxycycline (VIBRAMYCIN) 100 MG capsule   Oral   Take 1 capsule (100 mg total) by mouth 2 (two) times daily.   20 capsule   0   . etonogestrel (IMPLANON) 68 MG IMPL implant   Subcutaneous   Inject 1 each into the skin once.         Marland Kitchen ibuprofen (ADVIL,MOTRIN) 600 MG tablet   Oral   Take 1 tablet (600 mg total) by mouth every 8 (eight) hours as needed for pain.   30 tablet   0   . mupirocin ointment (BACTROBAN) 2 %   Topical   Apply topically 3 (three) times daily.   22 g   0    BP 123/74  Pulse 109  Temp(Src) 98.4 F (36.9 C) (Oral)  Resp 18  SpO2 100%  LMP 03/18/2013  Physical Exam  Nursing note and vitals reviewed. Constitutional: She is oriented to person, place, and time. She appears well-developed and well-nourished. No distress.  HENT:  Head: Normocephalic and atraumatic.  Eyes: Conjunctivae and EOM are normal. No scleral icterus.  Neck: Normal range of motion.  Cardiovascular: Normal rate, regular rhythm and intact distal pulses.   Distal radial pulses 2+ b/l  Pulmonary/Chest: Effort normal. No respiratory distress.  Musculoskeletal: Normal range of motion.  Neurological: She is alert and oriented to person, place, and time.  No sensory or motor deficits appreciated. Patient moves extremities without ataxia.  Skin: Skin is warm and dry. No rash noted. She is not diaphoretic. There is erythema. No pallor.  2 cm diameter abscess to the right axilla. Mild central fluctuance with  surrounding induration. Mild erythema. No red linear streaking or heat-to-touch. No superificial scaling.  Psychiatric: She has a normal mood and affect. Her behavior is normal.    ED Course  Medications - No data to display  DIAGNOSTIC STUDIES: Oxygen Saturation is 100% on room air, normal by my interpretation.    COORDINATION OF CARE: 5:18 PM- Discussed treatment plan with pt which includes I&D and treatment with antibiotics and pt agrees to plan.  Procedures (including critical care time)  INCISION AND DRAINAGE Performed by: Antony Madura, PA-C Consent: Verbal consent obtained. Risks and benefits: risks, benefits and alternatives were discussed Type: abscess  Body area: right axilla  Anesthesia: local infiltration  Incision was made with a scalpel.  Local anesthetic: lidocaine 2% without epinephrine  Anesthetic total: 1.5 ml  Complexity: complex Blunt dissection to break up loculations  Drainage: purulent and bloody  Drainage amount: small  Packing material: no packing  Patient tolerance: Patient tolerated the procedure well with no immediate complications.  Labs Reviewed - No data to display No results found.  1. Abscess of right axilla    MDM  Uncomplicated abscess of the right axilla. Patient is well and nontoxic appearing and hemodynamically stable. She is afebrile. Patient previously had a different abscess of the right axilla drained 3 days ago. Culture was positive for MRSA and  Sensitive to doxycycline which the patient has been taking for the last 3 days. Patient neurovascularly intact. No sensory or motor deficits appreciated. Abscess I&D at bedside which patient tolerated well. Patient appropriate for discharge with instruction to continue her current antibiotics as well as to apply warm moist compresses and to use warm soaks. Followup advised in 3 days if symptoms worsening or not improved. Patient agreeable to plan with no unaddressed concerns.  I  personally performed the services described in this documentation, which was scribed in my presence. The recorded information has been reviewed and is accurate.     Antony Madura, PA-C 03/22/13 1737

## 2013-03-22 NOTE — ED Notes (Signed)
Abscess culture R axilla: Mod. MRSA.  Pt. adequately treated with Doxycycline and Bactroban oint.  I called pt. Pt. verified x 2 and given results. Pt. told she was adequately treated.  Pt. said she had it before 2 yrs ago.  I reviewed the [facility] MRSA instructions with her.  Pt. voiced understanding. Vassie Moselle 03/22/2013

## 2013-03-23 NOTE — ED Provider Notes (Signed)
Medical screening examination/treatment/procedure(s) were performed by non-physician practitioner and as supervising physician I was immediately available for consultation/collaboration.   Leanor Voris E Denarius Sesler, MD 03/23/13 0039 

## 2014-05-31 ENCOUNTER — Encounter (HOSPITAL_COMMUNITY): Payer: Self-pay | Admitting: Cardiology

## 2014-12-03 IMAGING — US US ABDOMEN COMPLETE
1 series · 14 of 25 positions shown · non-contrast
Comparison: None.

CLINICAL DATA: Abdominal pain.

COMPLETE ABDOMINAL ULTRASOUND

[Series 1: us abdomen complete · 0.28mm/px · 14 of 69 slices shown]
[im 1/69]
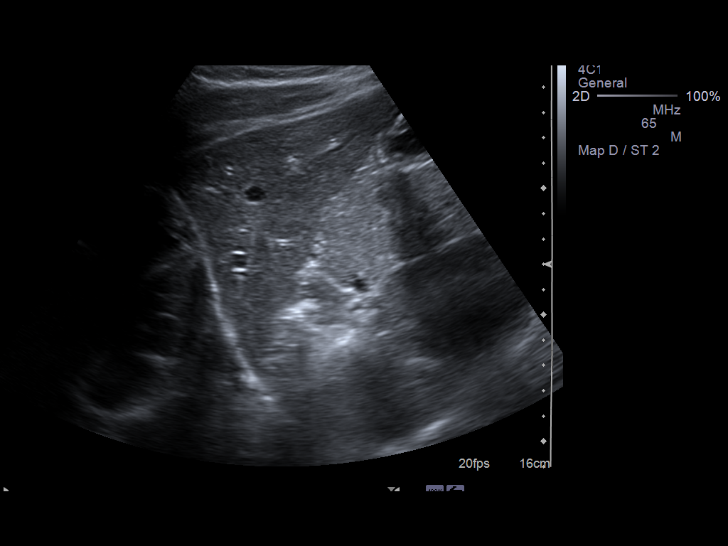
[im 6/69]
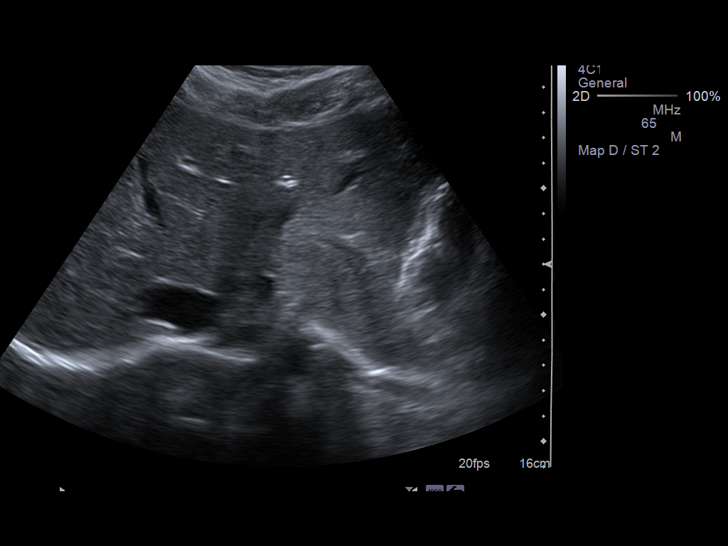
[im 12/69]
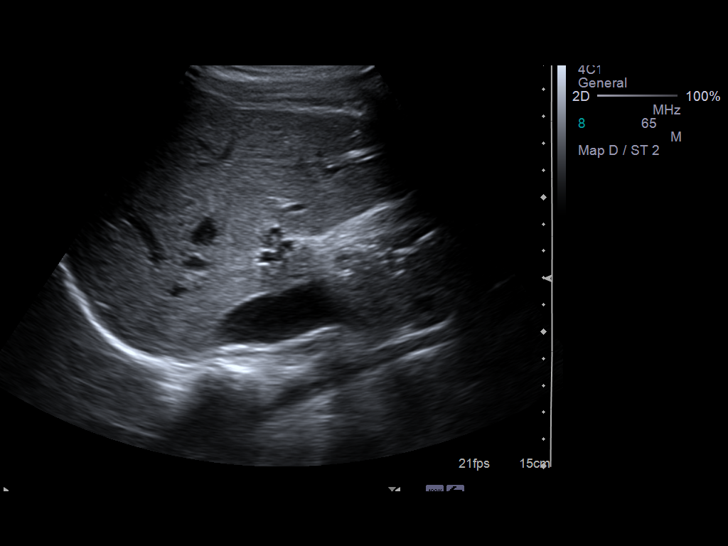
[im 18/69]
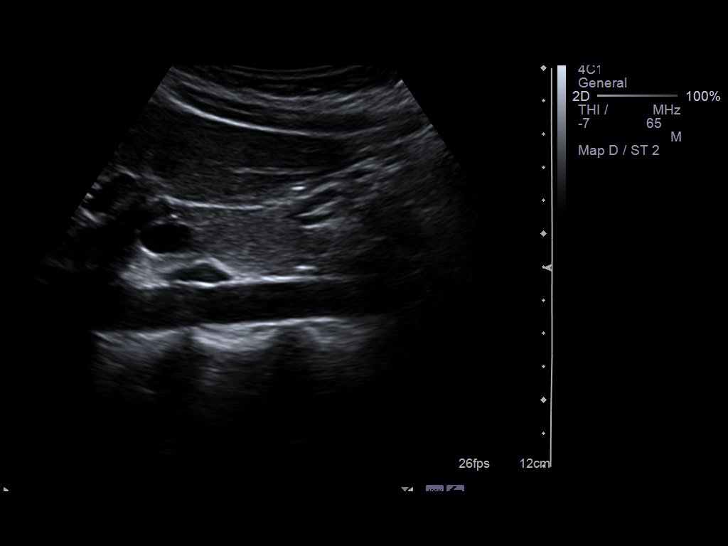
[im 23/69]
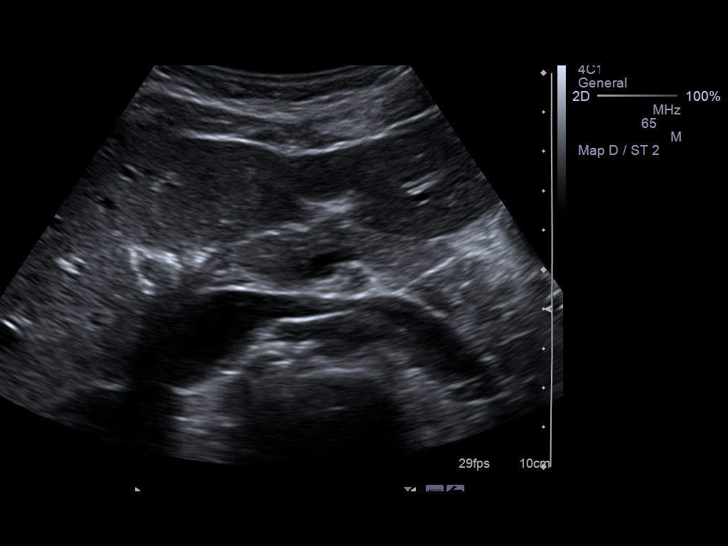
[im 26/69]
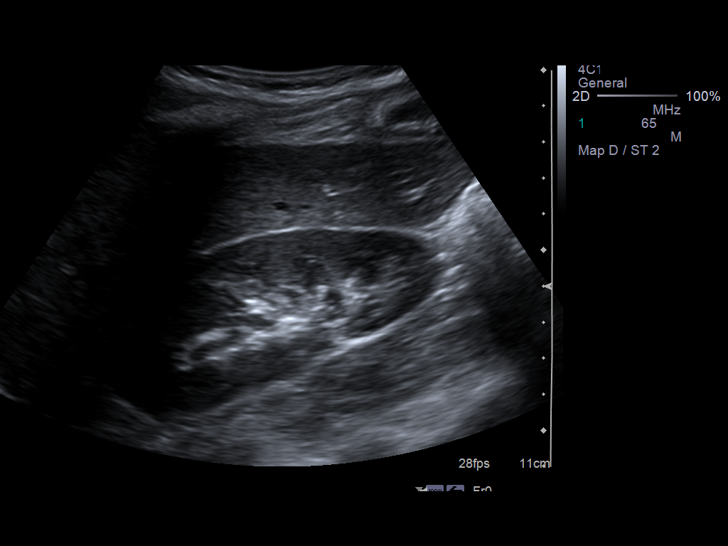
[im 32/69]
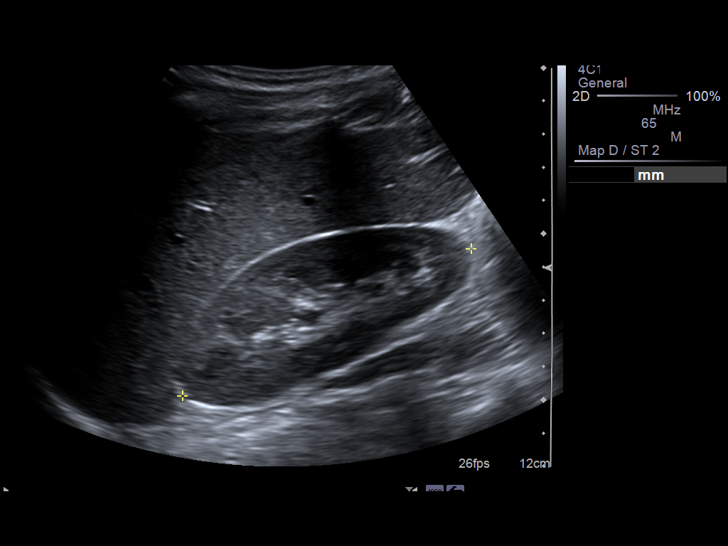
[im 37/69]
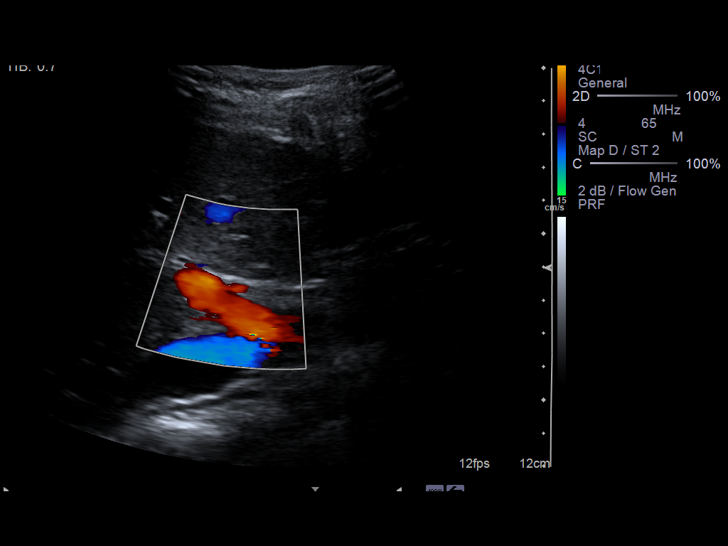
[im 43/69]
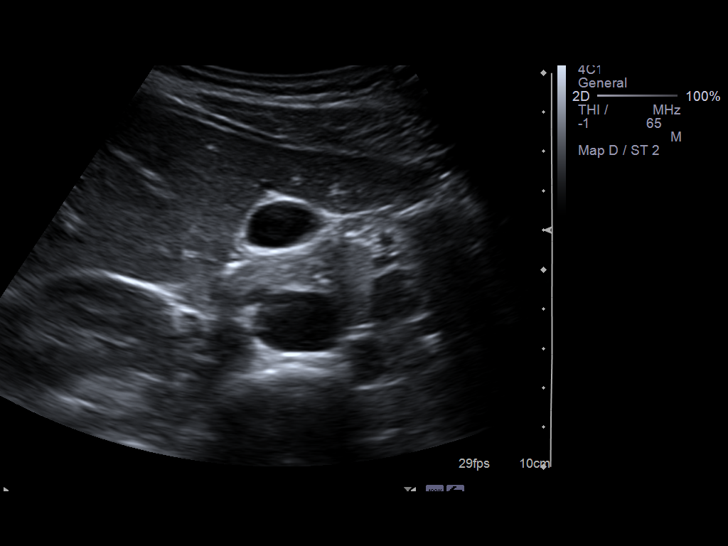
[im 46/69]
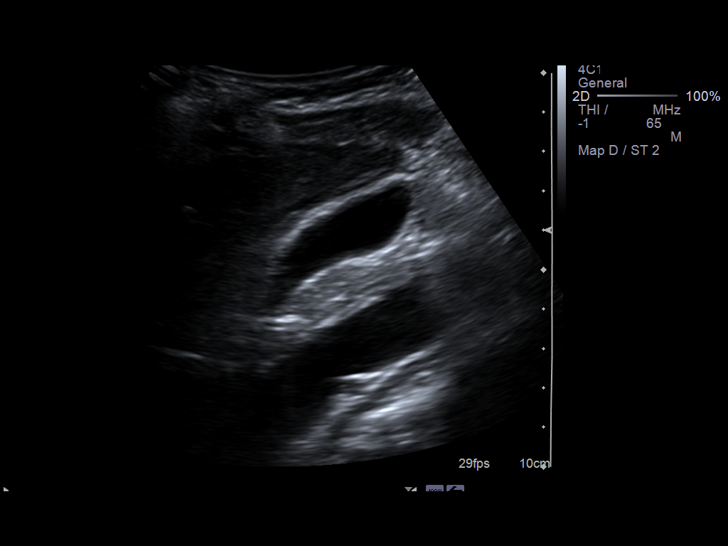
[im 52/69]
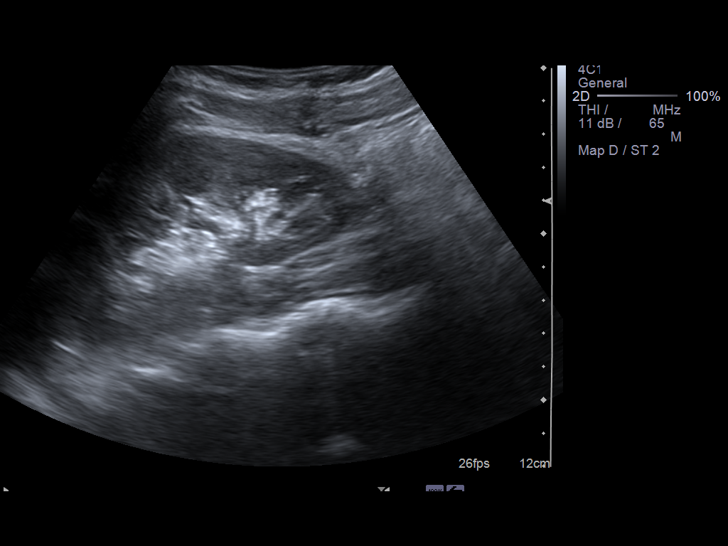
[im 57/69]
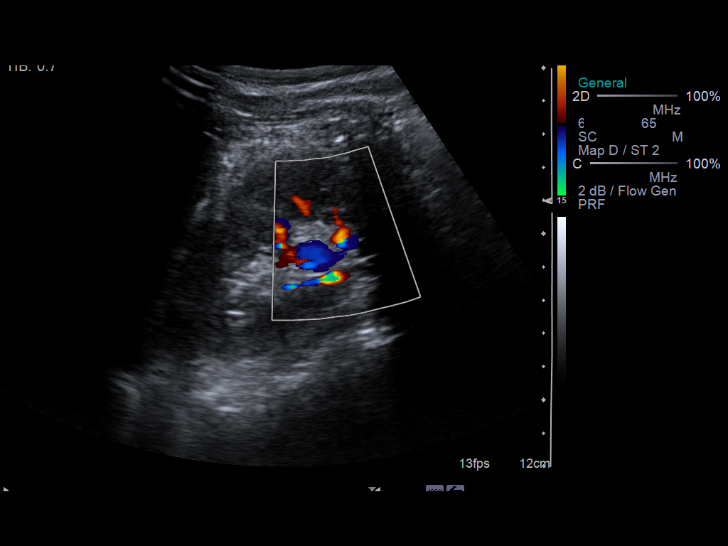
[im 63/69]
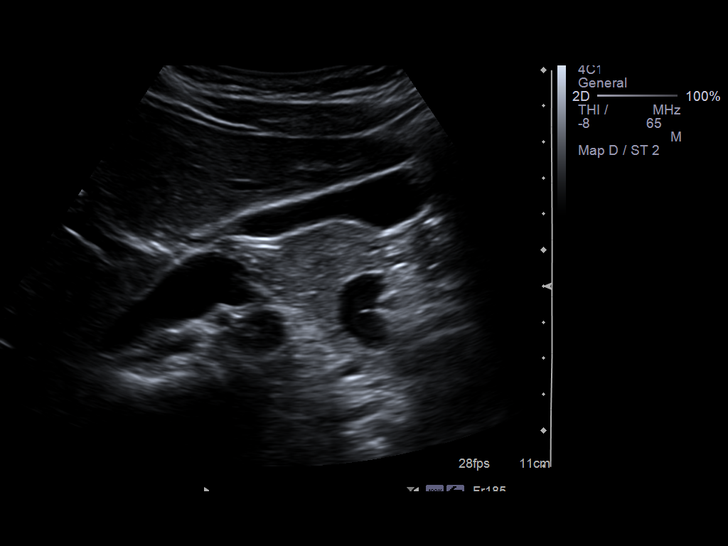
[im 69/69]
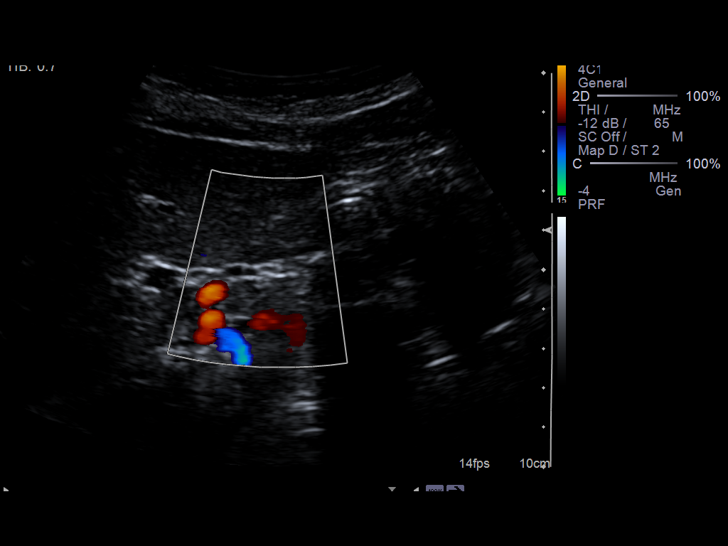

[14 of 25 positions shown; findings below may reference images not displayed]

FINDINGS: Gallbladder:  No gallstones, gallbladder wall thickening, or
pericholecystic fluid.

Common bile duct:   Within normal limits in caliber.

Liver:  No focal lesion identified.  Within normal limits in
parenchymal echogenicity.

IVC:  Appears normal.

Pancreas:  No focal abnormality seen.

Spleen:  Within normal limits in size and echotexture.

Right Kidney:   Normal in size and parenchymal echogenicity.  No
evidence of mass or hydronephrosis.

Left Kidney:  Normal in size and parenchymal echogenicity.  No
evidence of mass or hydronephrosis.

Abdominal aorta:  No aneurysm identified.
IMPRESSION: Negative abdominal ultrasound.

## 2015-01-06 LAB — OB RESULTS CONSOLE ANTIBODY SCREEN: ANTIBODY SCREEN: NEGATIVE

## 2015-01-06 LAB — OB RESULTS CONSOLE RUBELLA ANTIBODY, IGM: Rubella: IMMUNE

## 2015-01-06 LAB — OB RESULTS CONSOLE GC/CHLAMYDIA
CHLAMYDIA, DNA PROBE: NEGATIVE
GC PROBE AMP, GENITAL: NEGATIVE

## 2015-01-06 LAB — OB RESULTS CONSOLE HEPATITIS B SURFACE ANTIGEN: Hepatitis B Surface Ag: NEGATIVE

## 2015-01-06 LAB — OB RESULTS CONSOLE ABO/RH: RH Type: POSITIVE

## 2015-01-06 LAB — OB RESULTS CONSOLE HIV ANTIBODY (ROUTINE TESTING): HIV: NONREACTIVE

## 2015-01-06 LAB — OB RESULTS CONSOLE RPR: RPR: NONREACTIVE

## 2015-06-15 LAB — OB RESULTS CONSOLE GBS: STREP GROUP B AG: NEGATIVE

## 2015-07-11 ENCOUNTER — Encounter (HOSPITAL_COMMUNITY): Payer: Self-pay | Admitting: *Deleted

## 2015-07-11 ENCOUNTER — Telehealth (HOSPITAL_COMMUNITY): Payer: Self-pay | Admitting: *Deleted

## 2015-07-11 NOTE — Telephone Encounter (Signed)
Preadmission screen  

## 2015-07-14 ENCOUNTER — Other Ambulatory Visit: Payer: Self-pay | Admitting: Obstetrics and Gynecology

## 2015-07-15 ENCOUNTER — Encounter (HOSPITAL_COMMUNITY): Payer: Self-pay

## 2015-07-15 ENCOUNTER — Inpatient Hospital Stay (HOSPITAL_COMMUNITY)
Admission: RE | Admit: 2015-07-15 | Discharge: 2015-07-17 | DRG: 775 | Disposition: A | Discharge status: Home / Self Care | Payer: PRIVATE HEALTH INSURANCE | Source: Ambulatory Visit | Attending: Obstetrics and Gynecology | Admitting: Obstetrics and Gynecology

## 2015-07-15 ENCOUNTER — Inpatient Hospital Stay (HOSPITAL_COMMUNITY): Payer: PRIVATE HEALTH INSURANCE | Admitting: Anesthesiology

## 2015-07-15 VITALS — BP 90/47 | HR 69 | Temp 98.1°F | Resp 18 | Ht 62.0 in | Wt 175.0 lb

## 2015-07-15 DIAGNOSIS — Z87891 Personal history of nicotine dependence: Secondary | ICD-10-CM

## 2015-07-15 DIAGNOSIS — Z3A39 39 weeks gestation of pregnancy: Secondary | ICD-10-CM | POA: Diagnosis not present

## 2015-07-15 DIAGNOSIS — O26893 Other specified pregnancy related conditions, third trimester: Secondary | ICD-10-CM | POA: Diagnosis present

## 2015-07-15 DIAGNOSIS — Z3490 Encounter for supervision of normal pregnancy, unspecified, unspecified trimester: Secondary | ICD-10-CM

## 2015-07-15 HISTORY — DX: Other specified health status: Z78.9

## 2015-07-15 LAB — CBC
HEMATOCRIT: 35.7 % — AB (ref 36.0–46.0)
HEMOGLOBIN: 12.1 g/dL (ref 12.0–15.0)
MCH: 34.1 pg — ABNORMAL HIGH (ref 26.0–34.0)
MCHC: 33.9 g/dL (ref 30.0–36.0)
MCV: 100.6 fL — ABNORMAL HIGH (ref 78.0–100.0)
Platelets: 229 10*3/uL (ref 150–400)
RBC: 3.55 MIL/uL — ABNORMAL LOW (ref 3.87–5.11)
RDW: 12.5 % (ref 11.5–15.5)
WBC: 6.3 10*3/uL (ref 4.0–10.5)

## 2015-07-15 LAB — TYPE AND SCREEN
ABO/RH(D): O POS
ANTIBODY SCREEN: NEGATIVE

## 2015-07-15 LAB — MRSA PCR SCREENING: MRSA by PCR: NEGATIVE

## 2015-07-15 LAB — RPR: RPR Ser Ql: NONREACTIVE

## 2015-07-15 MED ORDER — DIPHENHYDRAMINE HCL 25 MG PO CAPS: 25.0000 mg | ORAL_CAPSULE | Freq: Four times a day (QID) | ORAL | Status: DC | PRN

## 2015-07-15 MED ORDER — IBUPROFEN 600 MG PO TABS
600.0000 mg | ORAL_TABLET | Freq: Four times a day (QID) | ORAL | Status: DC
  Administered 2015-07-15 – 2015-07-17 (×5): 600 mg via ORAL
  Filled 2015-07-15 (×7): qty 1

## 2015-07-15 MED ORDER — OXYCODONE-ACETAMINOPHEN 5-325 MG PO TABS: 1.0000 | ORAL_TABLET | ORAL | Status: DC | PRN

## 2015-07-15 MED ORDER — OXYCODONE-ACETAMINOPHEN 5-325 MG PO TABS: 2.0000 | ORAL_TABLET | ORAL | Status: DC | PRN

## 2015-07-15 MED ORDER — DIPHENHYDRAMINE HCL 50 MG/ML IJ SOLN: 12.5000 mg | INTRAMUSCULAR | Status: DC | PRN

## 2015-07-15 MED ORDER — BENZOCAINE-MENTHOL 20-0.5 % EX AERO
1.0000 "application " | INHALATION_SPRAY | CUTANEOUS | Status: DC | PRN
  Administered 2015-07-16: 1 via TOPICAL
  Filled 2015-07-15: qty 56

## 2015-07-15 MED ORDER — ACETAMINOPHEN 325 MG PO TABS
650.0000 mg | ORAL_TABLET | ORAL | Status: DC | PRN
  Administered 2015-07-16: 650 mg via ORAL
  Filled 2015-07-15: qty 2

## 2015-07-15 MED ORDER — PRENATAL MULTIVITAMIN CH
1.0000 | ORAL_TABLET | Freq: Every day | ORAL | Status: DC
  Administered 2015-07-16: 1 via ORAL
  Filled 2015-07-15 (×2): qty 1

## 2015-07-15 MED ORDER — LIDOCAINE HCL (PF) 1 % IJ SOLN
INTRAMUSCULAR | Status: DC | PRN
  Administered 2015-07-15: 4 mL via EPIDURAL
  Administered 2015-07-15: 2 mL via EPIDURAL
  Administered 2015-07-15: 4 mL via EPIDURAL

## 2015-07-15 MED ORDER — LANOLIN HYDROUS EX OINT: TOPICAL_OINTMENT | CUTANEOUS | Status: DC | PRN

## 2015-07-15 MED ORDER — MEASLES, MUMPS & RUBELLA VAC ~~LOC~~ INJ: 0.5000 mL | INJECTION | Freq: Once | SUBCUTANEOUS | Status: DC

## 2015-07-15 MED ORDER — WITCH HAZEL-GLYCERIN EX PADS: 1.0000 "application " | MEDICATED_PAD | CUTANEOUS | Status: DC | PRN

## 2015-07-15 MED ORDER — LACTATED RINGERS IV SOLN: INTRAVENOUS | Status: AC

## 2015-07-15 MED ORDER — OXYTOCIN 40 UNITS IN LACTATED RINGERS INFUSION - SIMPLE MED
1.0000 m[IU]/min | INTRAVENOUS | Status: DC
  Administered 2015-07-15: 1 m[IU]/min via INTRAVENOUS
  Filled 2015-07-15: qty 1000

## 2015-07-15 MED ORDER — OXYTOCIN 40 UNITS IN LACTATED RINGERS INFUSION - SIMPLE MED
62.5000 mL/h | INTRAVENOUS | Status: DC
Start: 2015-07-15 — End: 2015-07-15

## 2015-07-15 MED ORDER — FENTANYL 2.5 MCG/ML BUPIVACAINE 1/10 % EPIDURAL INFUSION (WH - ANES)
14.0000 mL/h | INTRAMUSCULAR | Status: DC | PRN
  Administered 2015-07-15: 14 mL/h via EPIDURAL
  Filled 2015-07-15: qty 125

## 2015-07-15 MED ORDER — LIDOCAINE HCL (PF) 1 % IJ SOLN
30.0000 mL | INTRAMUSCULAR | Status: DC | PRN
  Administered 2015-07-15: 30 mL via SUBCUTANEOUS
  Filled 2015-07-15: qty 30

## 2015-07-15 MED ORDER — SIMETHICONE 80 MG PO CHEW: 80.0000 mg | CHEWABLE_TABLET | ORAL | Status: DC | PRN

## 2015-07-15 MED ORDER — DIBUCAINE 1 % RE OINT: 1.0000 "application " | TOPICAL_OINTMENT | RECTAL | Status: DC | PRN

## 2015-07-15 MED ORDER — ZOLPIDEM TARTRATE 5 MG PO TABS: 5.0000 mg | ORAL_TABLET | Freq: Every evening | ORAL | Status: DC | PRN

## 2015-07-15 MED ORDER — ONDANSETRON HCL 4 MG/2ML IJ SOLN: 4.0000 mg | INTRAMUSCULAR | Status: DC | PRN

## 2015-07-15 MED ORDER — PHENYLEPHRINE 40 MCG/ML (10ML) SYRINGE FOR IV PUSH (FOR BLOOD PRESSURE SUPPORT)
80.0000 ug | PREFILLED_SYRINGE | INTRAVENOUS | Status: DC | PRN
  Filled 2015-07-15: qty 2
  Filled 2015-07-15: qty 20

## 2015-07-15 MED ORDER — OXYTOCIN 40 UNITS IN LACTATED RINGERS INFUSION - SIMPLE MED
62.5000 mL/h | INTRAVENOUS | Status: AC
  Administered 2015-07-15: 62.5 mL/h via INTRAVENOUS
  Filled 2015-07-15: qty 1000

## 2015-07-15 MED ORDER — LACTATED RINGERS IV SOLN
500.0000 mL | INTRAVENOUS | Status: DC | PRN
  Administered 2015-07-15 (×2): 500 mL via INTRAVENOUS

## 2015-07-15 MED ORDER — OXYCODONE-ACETAMINOPHEN 5-325 MG PO TABS
1.0000 | ORAL_TABLET | ORAL | Status: DC | PRN
  Administered 2015-07-15 – 2015-07-17 (×3): 1 via ORAL
  Filled 2015-07-15 (×3): qty 1

## 2015-07-15 MED ORDER — LACTATED RINGERS IV SOLN
INTRAVENOUS | Status: DC
  Administered 2015-07-15 (×2): via INTRAVENOUS

## 2015-07-15 MED ORDER — SENNOSIDES-DOCUSATE SODIUM 8.6-50 MG PO TABS
2.0000 | ORAL_TABLET | ORAL | Status: DC
  Administered 2015-07-17: 2 via ORAL
  Filled 2015-07-15: qty 2

## 2015-07-15 MED ORDER — OXYTOCIN BOLUS FROM INFUSION
500.0000 mL | INTRAVENOUS | Status: DC
  Administered 2015-07-15: 500 mL via INTRAVENOUS

## 2015-07-15 MED ORDER — PRENATAL MULTIVITAMIN CH: 1.0000 | ORAL_TABLET | Freq: Every day | ORAL | Status: DC

## 2015-07-15 MED ORDER — EPHEDRINE 5 MG/ML INJ
10.0000 mg | INTRAVENOUS | Status: DC | PRN
  Filled 2015-07-15: qty 2

## 2015-07-15 MED ORDER — ONDANSETRON HCL 4 MG PO TABS: 4.0000 mg | ORAL_TABLET | ORAL | Status: DC | PRN

## 2015-07-15 MED ORDER — TETANUS-DIPHTH-ACELL PERTUSSIS 5-2.5-18.5 LF-MCG/0.5 IM SUSP: 0.5000 mL | Freq: Once | INTRAMUSCULAR | Status: DC

## 2015-07-15 NOTE — H&P (Signed)
[name]:  [name], [name]             ACCOUNT NO.:  [id number]  MEDICAL RECORD NO.:  [id number]  LOCATION:  9161                          FACILITY:  [facility]  PHYSICIAN:  Malachi Prohomas F. Ambrose MantleHenley, M.D. DATE OF BIRTH:  [birth year]  DATE OF ADMISSION:  07/15/2015 DATE OF DISCHARGE:                             HISTORY & PHYSICAL   DATE OF ADMISSION:  07/15/2015  PRESENT ILLNESS:  This is a 24 year old black female, para 1-0-1-1, gravida 3, EDC on July 17, 2015, based on an ultrasound at 13 weeks and an ultrasound at 18 weeks.  The patient is admitted for induction of labor.  Blood group and type O positive.  Negative antibody.  RPR negative.  Urine culture negative.  Hepatitis B surface antigen negative.  HIV negative.  GC and Chlamydia negative.  Rubella immune. Cystic fibrosis negative.  One-hour Glucola 55.  Group B strep negative. Varicella zoster immune.  Quad screen negative.  Test was negative for fetal open spine birth defects, Down's syndrome, and trisomy 7318.  The patient began her prenatal course at 13 weeks.  She had a benign prenatal course.  At 38 weeks, her cervix became 2 cm and she is admitted for induction.  PAST MEDICAL HISTORY:  Reveals usual childhood diseases and frequent UTIs.  She did have wisdom teeth extracted in 2006.  FAMILY HISTORY:  Mother with high blood pressure.  Maternal grandmother with heart disease.  ALLERGIES:  She has no known drug allergies.  No latex or food allergies.  SOCIAL HISTORY:  The patient is a former smoker.  Does not drink. African American.  Occupation is a LawyerCNA.  She is single.  She denies illicit drugs.  PHYSICAL EXAMINATION:  VITAL SIGNS:  On admission, blood pressure is 102/60 and pulse is 100.  Weight is 176.4 pounds. ABDOMEN:  Fundal height is 38 cm.  Fetal heart tones are 156.  The cervix is not examined today, but on December 8th, it was 2 cm, 40%, vertex at a -3.  ADMITTING IMPRESSION:  Intrauterine pregnancy at 39+ weeks  with favorable cervix.  The patient is admitted for induction of labor.     Malachi Prohomas F. Ambrose MantleHenley, M.D.     TFH/MEDQ  D:  07/14/2015  T:  07/14/2015  Job:  [id number]

## 2015-07-15 NOTE — Progress Notes (Signed)
Patient's epidural catheter removed by RN.  Catheter blue tip intact.  Site covered with band-aid.

## 2015-07-15 NOTE — Progress Notes (Signed)
Patient ID: [name], female   DOB: 11-Aug-1990, 24 y.o.   MRN: [id number] Pt is admitted for induction of labor. FHR is category 1 and the cervix per the RN is 2 cm 50 % effaced and the vertex is at - 2 station.

## 2015-07-15 NOTE — Anesthesia Preprocedure Evaluation (Signed)
Anesthesia Evaluation  Patient identified by MRN, date of birth, ID band Patient awake    Reviewed: Allergy & Precautions, H&P , NPO status , Patient's Chart, lab work & pertinent test results  History of Anesthesia Complications Negative for: history of anesthetic complications  Airway Mallampati: II  TM Distance: >3 FB Neck ROM: full    Dental  (+) Teeth Intact   Pulmonary neg pulmonary ROS, Current Smoker,    breath sounds clear to auscultation       Cardiovascular negative cardio ROS   Rhythm:regular Rate:Normal     Neuro/Psych negative neurological ROS  negative psych ROS   GI/Hepatic negative GI ROS, Neg liver ROS,   Endo/Other  negative endocrine ROS  Renal/GU negative Renal ROS     Musculoskeletal   Abdominal   Peds  Hematology negative hematology ROS (+)   Anesthesia Other Findings Pregnancy - uncomplicated Platelets and allergies reviewed Denies active cardiac or pulmonary symptoms, METS > 4  Denies blood thinning medications, bleeding disorders, hypertension, asthma, supine hypotension syndrome, previous anesthesia difficulties    Reproductive/Obstetrics (+) Pregnancy                             Anesthesia Physical  Anesthesia Plan  ASA: II  Anesthesia Plan: Epidural   Post-op Pain Management:    Induction:   Airway Management Planned:   Additional Equipment:   Intra-op Plan:   Post-operative Plan:   Informed Consent: I have reviewed the patients History and Physical, chart, labs and discussed the procedure including the risks, benefits and alternatives for the proposed anesthesia with the patient or authorized representative who has indicated his/her understanding and acceptance.     Plan Discussed with:   Anesthesia Plan Comments:         Anesthesia Quick Evaluation

## 2015-07-15 NOTE — Progress Notes (Signed)
Patient ID: Sara BariShaniqua [name], female   DOB: 1990/08/12, 24 y.o.   MRN: [id number] Delivery note:     The pt reached 4 cm and received an epidural. She made great progress thereafter and had decelerations of the FHR that were controlled with positioning. After she reached full dilatation she pushed well and delivered a living female infant spontaneously ROA over an intact perineum. There were 2 loops of nuchal cord. The Apgars were 9 and 9 at 1 and 5 minutes. The placenta was intact and the uterus was normal. There was a right periurethral laceration that was repaired with 3-0 vicryl. EBL 200 cc's.

## 2015-07-15 NOTE — Progress Notes (Signed)
Patient ID: [name], female   DOB: 1991-05-26, 24 y.o.   MRN: [id number] Pitocin at 13 mu/minute and the contractions are q 2-3 minutes and pt rates then as 5/10. Her cervix is 2 cm 50 % effaced and the vertex is at - 3 station. AROM attempted but not certain it was successful.

## 2015-07-16 ENCOUNTER — Inpatient Hospital Stay (HOSPITAL_COMMUNITY)
Admission: AD | Admit: 2015-07-16 | Payer: PRIVATE HEALTH INSURANCE | Source: Ambulatory Visit | Admitting: Obstetrics and Gynecology

## 2015-07-16 LAB — CBC
HCT: 36.8 % (ref 36.0–46.0)
Hemoglobin: 12.4 g/dL (ref 12.0–15.0)
MCH: 33.6 pg (ref 26.0–34.0)
MCHC: 33.7 g/dL (ref 30.0–36.0)
MCV: 99.7 fL (ref 78.0–100.0)
PLATELETS: 204 10*3/uL (ref 150–400)
RBC: 3.69 MIL/uL — ABNORMAL LOW (ref 3.87–5.11)
RDW: 12.9 % (ref 11.5–15.5)
WBC: 10.2 10*3/uL (ref 4.0–10.5)

## 2015-07-16 NOTE — Anesthesia Postprocedure Evaluation (Signed)
Anesthesia Post Note  Patient: [name]  Procedure(s) Performed: * No procedures listed *  Patient location during evaluation: Mother Baby Anesthesia Type: Epidural Level of consciousness: awake and alert and oriented Pain management: satisfactory to patient Vital Signs Assessment: post-procedure vital signs reviewed and stable Respiratory status: spontaneous breathing and nonlabored ventilation Cardiovascular status: stable Postop Assessment: no headache, no backache, no signs of nausea or vomiting, adequate PO intake and patient able to bend at knees (patient up walking) Anesthetic complications: no    Last Vitals:  Filed Vitals:   07/16/15 0100 07/16/15 0853  BP: 133/65   Pulse: 89   Temp: 36.7 C 36.7 C  Resp: 18 16    Last Pain:  Filed Vitals:   07/16/15 0857  PainSc: Asleep                 Khara Renaud

## 2015-07-16 NOTE — Progress Notes (Signed)
Patient ID: [name], female   DOB: 1991-05-22, 24 y.o.   MRN: [id number] #1 afebrile BP normal HGB stable

## 2015-07-16 NOTE — Lactation Note (Signed)
This note was copied from the chart of [name]. Lactation Consultation Note  Patient Name: [name] ZOXWR'UToday's Date: 07/16/2015 Reason for consult: Initial assessment  With this mom of a term baby, now 3223 hours old. Mom was attempting to feed in football position, while sitting on the side of her bed. I advised mom to always have her back supported while feeding, and to always bring the baby to breast to obtain a deep latch. Mom sat in the couch, and in football position, her baby latched deeply, with a strong suckles and visible swallows. Mom agreed this was more comfortable. Basic teaching done from the baby and Me book on breastfeeding, and lactation services also reviewed. Mom knows to call for questions/concerns.    Maternal Data Formula Feeding for Exclusion: No Has patient been taught Hand Expression?: Yes Does the patient have breastfeeding experience prior to this delivery?: Yes  Feeding Feeding Type: Breast Fed Length of feed: 15 min (mom still breast feeding when I left the room)  LATCH Score/Interventions Latch: Grasps breast easily, tongue down, lips flanged, rhythmical sucking.  Audible Swallowing: Spontaneous and intermittent (visual, easily expressed colostrum)  Type of Nipple: Everted at rest and after stimulation  Comfort (Breast/Nipple): Soft / non-tender     Hold (Positioning): Assistance needed to correctly position infant at breast and maintain latch. Intervention(s): Breastfeeding basics reviewed;Support Pillows;Position options;Skin to skin  LATCH Score: 9  Lactation Tools Discussed/Used     Consult Status Consult Status: Follow-up Date: 07/17/15 Follow-up type: In-patient    Alfred LevinsLee, Rettie Laird Anne 07/16/2015, 3:32 PM

## 2015-07-17 MED ORDER — IBUPROFEN 600 MG PO TABS: 600.0000 mg | ORAL_TABLET | Freq: Four times a day (QID) | ORAL | Status: AC | PRN

## 2015-07-17 MED ORDER — OXYCODONE-ACETAMINOPHEN 5-325 MG PO TABS: 1.0000 | ORAL_TABLET | Freq: Four times a day (QID) | ORAL | Status: DC | PRN

## 2015-07-17 NOTE — Progress Notes (Signed)
Patient ID: [name], female   DOB: 04/19/1991, 24 y.o.   MRN: [id number] #2 afebrile BP normal for d/c

## 2015-07-17 NOTE — Discharge Instructions (Signed)
booklet °

## 2015-07-17 NOTE — Discharge Summary (Signed)
[name]:  [name], [name]             ACCOUNT NO.:  [id number]  MEDICAL RECORD NO.:  [id number]  LOCATION:  9120                          [facility]ACILITY:  [facility]  PHYSICIAN:  Malachi Prohomas F. Ambrose MantleHenley, M.D. DATE OF BIRTH:  1991-05-24  DATE OF ADMISSION:  07/15/2015 DATE OF DISCHARGE:  07/17/2015                              DISCHARGE SUMMARY   HISTORY OF PRESENT ILLNESS:  This is a 24 year old black female, para 1- 0-1-1, who is admitted for induction of labor.  Cervix was 2 cm, 40%, vertex, with a -3 on admission.  She was begun on Pitocin.  Fetal heart rate was category 1.  By 1:21 p.m., the cervix was 2 cm, 50%, vertex, at a -3.  Artificial rupture of the membranes was questionably successful, but it became obvious that it was successful.  She received an epidural. She made great progress thereafter and had decelerations of the fetal heart rate.  There were controlled with positioning.  After she reached full dilatation, she pushed well and delivered a living female infant spontaneously ROA over an intact perineum.  There were 2 loops of nuchal cord.  Apgars were 9 and 9 at 1 and 5 minutes.  Placenta was intact. Uterus normal.  Right periurethral laceration was repaired with 3-0 Vicryl.  Blood loss 200 mL.  Postpartum, the patient did well and was discharged on the second postpartum day.  Initial hemoglobin was 12.1, hematocrit 35.7, white count 6300, platelet count 229,000, and followup hemoglobin 12.4.  FINAL DIAGNOSES:  Intrauterine pregnancy at 39 weeks, delivered ROA, 2 loops of nuchal cord.  OPERATION:  Spontaneous delivery, ROA, repair of right periurethral laceration.  FINAL CONDITION:  Improved.  INSTRUCTIONS:  Include our regular discharge instruction booklet as well as the after visit summary.  The patient is advised to return to the office in 6 weeks for followup examination.  She was advised to refrain from intercourse, so that any birth control method could be done at that time,  and she is given prescriptions for Percocet 5/325, 30 tablets, 1 every 6 hours as needed for pain and Motrin 600 mg 30 tablets, 1 every 6 hours as needed for pain.     Malachi Prohomas F. Ambrose MantleHenley, M.D.     TFH/MEDQ  D:  07/17/2015  T:  07/17/2015  Job:  [id number]

## 2015-07-17 NOTE — Lactation Note (Signed)
This note was copied from the chart of [name]. Lactation Consultation NotTamala Barie  Patient Name: Sara Tamala BariShaniqua Escamilla ZOXWR'UToday's Date: 07/17/2015 Reason for consult: Follow-up assessment  Baby 42 hours old. Patient's bedside nurse, Clydie BraunKaren, RN, and mom state that baby is nursing well. Mom is packing for discharge. Mom aware of OP/BFSG and LC phone line assistance after D/C.  Maternal Data    Feeding Feeding Type: Breast Fed Length of feed: 15 min  LATCH Score/Interventions Latch: Grasps breast easily, tongue down, lips flanged, rhythmical sucking.  Audible Swallowing: Spontaneous and intermittent  Type of Nipple: Everted at rest and after stimulation  Comfort (Breast/Nipple): Soft / non-tender     Hold (Positioning): No assistance needed to correctly position infant at breast.  LATCH Score: 10  Lactation Tools Discussed/Used     Consult Status Consult Status: Complete    Geralynn OchsWILLIARD, Jim Lundin 07/17/2015, 10:22 AM

## 2017-01-24 ENCOUNTER — Encounter (HOSPITAL_COMMUNITY): Payer: Self-pay | Admitting: *Deleted

## 2017-01-24 ENCOUNTER — Ambulatory Visit (HOSPITAL_COMMUNITY)
Admission: EM | Admit: 2017-01-24 | Discharge: 2017-01-24 | Disposition: A | Discharge status: Home / Self Care | Payer: BLUE CROSS/BLUE SHIELD | Attending: Internal Medicine | Admitting: Internal Medicine

## 2017-01-24 DIAGNOSIS — J00 Acute nasopharyngitis [common cold]: Secondary | ICD-10-CM

## 2017-01-24 DIAGNOSIS — G4489 Other headache syndrome: Secondary | ICD-10-CM

## 2017-01-24 MED ORDER — METHYLPREDNISOLONE 4 MG PO TBPK: ORAL_TABLET | ORAL | 0 refills | Status: DC

## 2017-01-24 MED ORDER — AZITHROMYCIN 250 MG PO TABS: 250.0000 mg | ORAL_TABLET | Freq: Every day | ORAL | 0 refills | Status: AC

## 2017-01-24 MED ORDER — IPRATROPIUM BROMIDE 0.06 % NA SOLN: 2.0000 | Freq: Four times a day (QID) | NASAL | 0 refills | Status: AC

## 2017-01-24 NOTE — ED Triage Notes (Signed)
Headache      Congestion   Coughing      sorethroat    X        s  Weeks          Taking otc  meds   withouut  releif

## 2017-01-24 NOTE — ED Provider Notes (Signed)
CSN: [id number]     Arrival date & time 01/24/17  1457 History   None    Chief Complaint  Patient presents with  . Headache   (Consider location/radiation/quality/duration/timing/severity/associated sxs/prior Treatment) Patient c/o congestion, cough, sore throat, and uri sx's for 2 weeks.   The history is provided by the patient.  Headache  Pain location:  Generalized Quality:  Dull Radiates to:  Does not radiate Severity currently:  5/10 Severity at highest:  5/10 Onset quality:  Sudden Duration:  2 days Timing:  Constant Progression:  Worsening Chronicity:  New Similar to prior headaches: no   Relieved by:  Nothing Worsened by:  Nothing Associated symptoms: congestion and drainage     Past Medical History:  Diagnosis Date  . Abnormal Pap smear   . Frequent UTI   . Medical history non-contributory   . No pertinent past medical history    Past Surgical History:  Procedure Laterality Date  . DENTAL SURGERY     Family History  Problem Relation Age of Onset  . Hypertension Mother   . Heart disease Maternal Grandmother    Social History  Substance Use Topics  . Smoking status: Current Every Day Smoker    Last attempt to quit: 07/22/2010  . Smokeless tobacco: Not on file  . Alcohol use Yes   OB History    Gravida Para Term Preterm AB Living   3 2 2   1 2    SAB TAB Ectopic Multiple Live Births   1     0 2     Review of Systems  Constitutional: Negative.   HENT: Positive for congestion and postnasal drip.   Eyes: Negative.   Respiratory: Negative.   Cardiovascular: Negative.   Gastrointestinal: Negative.   Endocrine: Negative.   Genitourinary: Negative.   Musculoskeletal: Negative.   Allergic/Immunologic: Negative.   Neurological: Positive for headaches.  Hematological: Negative.   Psychiatric/Behavioral: Negative.     Allergies  Patient has no known allergies.  Home Medications   Prior to Admission medications   Medication Sig Start Date End  Date Taking? Authorizing Provider  azithromycin (ZITHROMAX) 250 MG tablet Take 1 tablet (250 mg total) by mouth daily. Take first 2 tablets together, then 1 every day until finished. 01/24/17   Deatra Canterxford, William J, FNP  ibuprofen (ADVIL,MOTRIN) 600 MG tablet Take 1 tablet (600 mg total) by mouth every 6 (six) hours as needed. 07/17/15   Tracey HarriesHenley, Thomas, MD  ipratropium (ATROVENT) 0.06 % nasal spray Place 2 sprays into both nostrils 4 (four) times daily. 01/24/17   Deatra Canterxford, William J, FNP  methylPREDNISolone (MEDROL DOSEPAK) 4 MG TBPK tablet Take 6-5-4-3-2-1 po qd 01/24/17   Deatra Canterxford, William J, FNP  oxyCODONE-acetaminophen (PERCOCET/ROXICET) 5-325 MG tablet Take 1 tablet by mouth every 6 (six) hours as needed (for pain scale 4-7). 07/17/15   Tracey HarriesHenley, Thomas, MD  Prenatal Vit-Fe Fumarate-FA (PRENATAL MULTIVITAMIN) TABS tablet Take 1 tablet by mouth daily at 12 noon.    [provider]   Meds Ordered and Administered this Visit  Medications - No data to display  BP 123/70 (BP Location: Right Arm)   Pulse 97   Temp 99.2 F (37.3 C) (Oral)   Resp 16   LMP 01/17/2017   SpO2 97%   Breastfeeding? Yes  No data found.   Physical Exam  Constitutional: She is oriented to person, place, and time. She appears well-developed and well-nourished.  HENT:  Head: Normocephalic and atraumatic.  Eyes: Conjunctivae and EOM are normal.  Pupils are equal, round, and reactive to light.  Neck: Normal range of motion. Neck supple.  Cardiovascular: Normal rate, regular rhythm and normal heart sounds.   Pulmonary/Chest: Effort normal and breath sounds normal.  Abdominal: Soft. Bowel sounds are normal.  Neurological: She is alert and oriented to person, place, and time.  Nursing note and vitals reviewed.   Urgent Care Course     Procedures (including critical care time)  Labs Review Labs Reviewed - No data to display  Imaging Review No results found.   Visual Acuity Review  Right Eye Distance:    Left Eye Distance:   Bilateral Distance:    Right Eye Near:   Left Eye Near:    Bilateral Near:         MDM   1. Other headache syndrome   2. Acute nasopharyngitis    Zpak Atrovent nasal spray Medrol dose pack as directed 4mg  #21     Deatra Canter, FNP 01/24/17 1605

## 2023-01-04 ENCOUNTER — Emergency Department (HOSPITAL_COMMUNITY): Payer: PRIVATE HEALTH INSURANCE

## 2023-01-04 ENCOUNTER — Emergency Department (HOSPITAL_COMMUNITY)
Admission: EM | Admit: 2023-01-04 | Discharge: 2023-01-05 | Disposition: A | Discharge status: Home / Self Care | Payer: PRIVATE HEALTH INSURANCE | Attending: Emergency Medicine | Admitting: Emergency Medicine

## 2023-01-04 ENCOUNTER — Other Ambulatory Visit: Payer: Self-pay

## 2023-01-04 DIAGNOSIS — R072 Precordial pain: Secondary | ICD-10-CM | POA: Insufficient documentation

## 2023-01-04 DIAGNOSIS — R002 Palpitations: Secondary | ICD-10-CM | POA: Insufficient documentation

## 2023-01-04 DIAGNOSIS — R0602 Shortness of breath: Secondary | ICD-10-CM | POA: Insufficient documentation

## 2023-01-04 LAB — CBC
HCT: 38.1 % (ref 36.0–46.0)
Hemoglobin: 13 g/dL (ref 12.0–15.0)
MCH: 34.5 pg — ABNORMAL HIGH (ref 26.0–34.0)
MCHC: 34.1 g/dL (ref 30.0–36.0)
MCV: 101.1 fL — ABNORMAL HIGH (ref 80.0–100.0)
Platelets: 343 10*3/uL (ref 150–400)
RBC: 3.77 MIL/uL — ABNORMAL LOW (ref 3.87–5.11)
RDW: 12 % (ref 11.5–15.5)
WBC: 7.7 10*3/uL (ref 4.0–10.5)
nRBC: 0 % (ref 0.0–0.2)

## 2023-01-04 LAB — BASIC METABOLIC PANEL
Anion gap: 13 (ref 5–15)
BUN: 10 mg/dL (ref 6–20)
CO2: 20 mmol/L — ABNORMAL LOW (ref 22–32)
Calcium: 9.2 mg/dL (ref 8.9–10.3)
Chloride: 107 mmol/L (ref 98–111)
Creatinine, Ser: 1.02 mg/dL — ABNORMAL HIGH (ref 0.44–1.00)
GFR, Estimated: >60 mL/min (ref >60)
Glucose, Bld: 102 mg/dL — ABNORMAL HIGH (ref 70–99)
Potassium: 3.7 mmol/L (ref 3.5–5.1)
Sodium: 140 mmol/L (ref 135–145)

## 2023-01-04 LAB — I-STAT BETA HCG BLOOD, ED (MC, WL, AP ONLY): I-stat hCG, quantitative: <5 m[IU]/mL (ref <5)

## 2023-01-04 LAB — TROPONIN I (HIGH SENSITIVITY)
Troponin I (High Sensitivity): 3 ng/L (ref <18)
Troponin I (High Sensitivity): <2 ng/L (ref <18)

## 2023-01-04 NOTE — ED Triage Notes (Signed)
Patient reports intermittent palpitations, sob, and chest pain since 1100 yesterday. States for years she's had occasional heart flutters but never this consistent.

## 2023-01-05 LAB — D-DIMER, QUANTITATIVE: D-Dimer, Quant: 0.4 ug/mL-FEU (ref 0.00–0.50)

## 2023-01-05 NOTE — ED Provider Notes (Signed)
[place] EMERGENCY DEPARTMENT AT [facility] Provider Note   CSN: [id number] Arrival date & time: 01/04/23  1839     History  Chief Complaint  Patient presents with   Shortness of Breath   Palpitations    [name] is a 32 y.o. female.  The history is provided by the patient.  Shortness of Breath Palpitations Associated symptoms: shortness of breath   [name] is a 32 y.o. female who presents to the Emergency Department complaining of chest pain and difficulty breathing.  She presents to the emergency department for evaluation of symptoms that started yesterday.  Yesterday she started developing central, sharp chest pain with associated rapid heartbeat to the 140s with shortness of breath.  Her pain comes and goes but she feels like it is worse with activity.  No associated fever.  She has a mild cough, which she attributes to allergies.  She has experienced intermittent palpitations in the past but she describes his prior palpitations as a slight flutter and she did not have associated pain with that.  She has occasional ankle swelling, which is an ongoing problem.  She has no known medical problems.  She is on Depo-Provera.  No recent surgeries or travel.  She has a family history of hypertension, diabetes, coronary artery disease in her father at 72, grandmother in her 50s.  No family or personal history of DVT/PE.   Occasional tobacco, alcohol.  No drugs.      Home Medications Prior to Admission medications   Medication Sig Start Date End Date Taking? Authorizing Provider  azithromycin (ZITHROMAX) 250 MG tablet Take 1 tablet (250 mg total) by mouth daily. Take first 2 tablets together, then 1 every day until finished. 01/24/17   Deatra Canter, FNP  ibuprofen (ADVIL,MOTRIN) 600 MG tablet Take 1 tablet (600 mg total) by mouth every 6 (six) hours as needed. 07/17/15   Tracey Harries, MD  ipratropium (ATROVENT) 0.06 % nasal spray Place 2 sprays into  both nostrils 4 (four) times daily. 01/24/17   Deatra Canter, FNP  methylPREDNISolone (MEDROL DOSEPAK) 4 MG TBPK tablet Take 6-5-4-3-2-1 po qd 01/24/17   Deatra Canter, FNP  oxyCODONE-acetaminophen (PERCOCET/ROXICET) 5-325 MG tablet Take 1 tablet by mouth every 6 (six) hours as needed (for pain scale 4-7). 07/17/15   Tracey Harries, MD  Prenatal Vit-Fe Fumarate-FA (PRENATAL MULTIVITAMIN) TABS tablet Take 1 tablet by mouth daily at 12 noon.    [provider]      Allergies    Patient has no known allergies.    Review of Systems   Review of Systems  Respiratory:  Positive for shortness of breath.   Cardiovascular:  Positive for palpitations.  All other systems reviewed and are negative.   Physical Exam Updated Vital Signs BP (!) 131/106 (BP Location: Right Arm)   Pulse 86   Temp 98.2 F (36.8 C) (Oral)   Resp 18   SpO2 99%  Physical Exam Vitals and nursing note reviewed.  Constitutional:      Appearance: She is well-developed.  HENT:     Head: Normocephalic and atraumatic.  Cardiovascular:     Rate and Rhythm: Normal rate and regular rhythm.     Heart sounds: No murmur heard. Pulmonary:     Effort: Pulmonary effort is normal. No respiratory distress.     Breath sounds: Normal breath sounds.  Abdominal:     Palpations: Abdomen is soft.     Tenderness: There is no abdominal tenderness.  There is no guarding or rebound.  Musculoskeletal:        General: No swelling or tenderness.  Skin:    General: Skin is warm and dry.  Neurological:     Mental Status: She is alert and oriented to person, place, and time.  Psychiatric:        Behavior: Behavior normal.     ED Results / Procedures / Treatments   Labs (all labs ordered are listed, but only abnormal results are displayed) Labs Reviewed  BASIC METABOLIC PANEL - Abnormal; Notable for the following components:      Result Value   CO2 20 (*)    Glucose, Bld 102 (*)    Creatinine, Ser 1.02 (*)    All  other components within normal limits  CBC - Abnormal; Notable for the following components:   RBC 3.77 (*)    MCV 101.1 (*)    MCH 34.5 (*)    All other components within normal limits  D-DIMER, QUANTITATIVE  I-STAT BETA HCG BLOOD, ED ([facility], [facility], [facility] ONLY)  TROPONIN I (HIGH SENSITIVITY)  TROPONIN I (HIGH SENSITIVITY)    EKG EKG Interpretation  Date/Time:  Friday January 04 2023 18:59:04 EDT Ventricular Rate:  102 PR Interval:  120 QRS Duration: 90 QT Interval:  336 QTC Calculation: 437 R Axis:   82 Text Interpretation: Sinus tachycardia Otherwise normal ECG No previous ECGs available Confirmed by Tilden Fossa [phone]) on 01/04/2023 11:45:57 PM  Radiology DG Chest 2 View  Result Date: 01/04/2023 CLINICAL DATA:  Shortness of breath and palpitations. EXAM: CHEST - 2 VIEW COMPARISON:  None Available. FINDINGS: The cardiomediastinal contours are normal. The lungs are clear. Pulmonary vasculature is normal. No consolidation, pleural effusion, or pneumothorax. No acute osseous abnormalities are seen. IMPRESSION: Negative radiographs of the chest. Electronically Signed   By: Narda Rutherford M.D.   On: 01/04/2023 19:50    Procedures Procedures    Medications Ordered in ED Medications - No data to display  ED Course/ Medical Decision Making/ A&P                             Medical Decision Making Amount and/or Complexity of Data Reviewed Labs: ordered. Radiology: ordered.  Pt with no significant medical history here for evaluation of chest pain, palpitations and sob since yesterday.  EKG was sinus tach, tachycardia resolved without intervention.  Lungs are clear on examination without respiratory distress.  No clear wheezing or crackles.  Chest x-ray is negative for acute infiltrate-images personally reviewed and interpreted, agree with radiologist interpretation.  EKG is without acute ischemic changes and troponins are negative x 2.  No significant electrolyte abnormality.  No  significant anemia.  She is low risk for PE and D-dimer is negative, current clinical picture is not consistent with PE, ACS, pneumonia, dissection.  Discussed with patient unclear source of her chest discomfort and palpitations.  Given her recurrent palpitations will refer to cardiology for further evaluation.  Discussed return precautions for new or concerning symptoms.         Final Clinical Impression(s) / ED Diagnoses Final diagnoses:  Palpitations  Precordial pain    Rx / DC Orders ED Discharge Orders          Ordered    Ambulatory referral to Cardiology       Comments: Re palpitations   01/05/23 0127              Madilyn Hook,  Lanora Manis, MD 01/05/23 [phone]

## 2023-02-21 ENCOUNTER — Encounter: Payer: Self-pay | Admitting: Cardiovascular Disease

## 2023-02-21 ENCOUNTER — Ambulatory Visit: Payer: PRIVATE HEALTH INSURANCE | Attending: Cardiovascular Disease | Admitting: Cardiovascular Disease

## 2023-02-21 ENCOUNTER — Ambulatory Visit: Payer: PRIVATE HEALTH INSURANCE | Attending: Cardiovascular Disease

## 2023-02-21 VITALS — BP 110/70 | HR 99 | Ht 62.0 in | Wt 177.6 lb

## 2023-02-21 DIAGNOSIS — R0602 Shortness of breath: Secondary | ICD-10-CM

## 2023-02-21 DIAGNOSIS — R002 Palpitations: Secondary | ICD-10-CM

## 2023-02-21 DIAGNOSIS — R072 Precordial pain: Secondary | ICD-10-CM

## 2023-02-21 DIAGNOSIS — F17210 Nicotine dependence, cigarettes, uncomplicated: Secondary | ICD-10-CM

## 2023-02-21 DIAGNOSIS — Z720 Tobacco use: Secondary | ICD-10-CM

## 2023-02-21 NOTE — Progress Notes (Signed)
Cardiology Office Note:   Date:  02/21/2023  NAME:  [name]    MRN: [id number] DOB:  [birth year]   PCP:  System, Provider Not In  Cardiologist:  Reatha Harps, MD  Electrophysiologist:  None   Referring MD: Tilden Fossa, MD   Chief Complaint  Patient presents with   Palpitations         History of Present Illness:   [name] is a 32 y.o. female with a hx of UTI who is being seen today for the evaluation of palpitations/SOB at the request of Tilden Fossa, MD. she was seen in the emergency room on January 07, 2023.  She reports waking up and having heart racing episodes.  Also describes a sharp chest pain and shortness of breath.  Symptoms would come and go.  They occurred for 24 to 48 hours.  No identifiable trigger.  Symptoms can last minutes and resolved without intervention.  She reports she went to the ER and workup was negative.  She is continue to have other episodes but are less severe.  She describes her heart racing for 30 seconds once a week.  Reports no further chest pains or trouble breathing.  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  She is smoking 3 to 4 cigarettes/day.  She is trying to quit.  She smoked for 15 years.  She has no other medical problems.  She has not had a thyroid test recently.  Lab work in the ER was negative.  She reports family history of heart disease in her father.  Mom has hypertension and diabetes.  She can exercise without limitations.  She is drinking plenty of water.  CV exam normal.  EKG in office is normal.  We discussed that symptoms could represent arrhythmia or something else.  She describes no stress or anxiety.  No new medications.  She does describe flutters for years.  Has never had any episodes this bad.  Past Medical History: Past Medical History:  Diagnosis Date   Abnormal Pap smear    Frequent UTI    Medical history non-contributory    No pertinent past medical history     Past Surgical  History: Past Surgical History:  Procedure Laterality Date   DENTAL SURGERY      Current Medications: Current Meds  Medication Sig   azithromycin (ZITHROMAX) 250 MG tablet Take 1 tablet (250 mg total) by mouth daily. Take first 2 tablets together, then 1 every day until finished.   ibuprofen (ADVIL,MOTRIN) 600 MG tablet Take 1 tablet (600 mg total) by mouth every 6 (six) hours as needed.   ipratropium (ATROVENT) 0.06 % nasal spray Place 2 sprays into both nostrils 4 (four) times daily. (Patient taking differently: Place 2 sprays into both nostrils 4 (four) times daily. prn)   Prenatal Vit-Fe Fumarate-FA (PRENATAL MULTIVITAMIN) TABS tablet Take 1 tablet by mouth daily at 12 noon.     Allergies:    Patient has no known allergies.   Social History: Social History   Socioeconomic History   Marital status: Single    Spouse name: Not on file   Number of children: 2   Years of education: Not on file   Highest education level: Not on file  Occupational History   Occupation: Home health nurse  Tobacco Use   Smoking status: Former    Current packs/day: 0.25    Average packs/day: 0.3 packs/day for 15.0 years (3.8 ttl pk-yrs)    Types: Cigarettes  Start date: 02/21/2008   Smokeless tobacco: Not on file  Substance and Sexual Activity   Alcohol use: Yes   Drug use: No   Sexual activity: Yes    Birth control/protection: Implant  Other Topics Concern   Not on file  Social History Narrative   Not on file   Social Determinants of Health   Financial Resource Strain: Not on file  Food Insecurity: Not on file  Transportation Needs: Not on file  Physical Activity: Not on file  Stress: Not on file  Social Connections: Not on file     Family History: The patient's family history includes Heart attack in her father; Heart disease in her maternal grandmother; Hypertension in her mother.  ROS:   All other ROS reviewed and negative. Pertinent positives noted in the HPI.      EKGs/Labs/Other Studies Reviewed:   The following studies were personally reviewed by me today:  EKG:  EKG is ordered today.    EKG Interpretation Date/Time:  Thursday February 21 2023 14:34:04 EDT Ventricular Rate:  90 PR Interval:  126 QRS Duration:  78 QT Interval:  358 QTC Calculation: 437 R Axis:   73  Text Interpretation: Normal sinus rhythm Normal ECG Confirmed by Lennie Odor [phone]) on 02/21/2023 2:37:55 PM   Recent Labs: 01/04/2023: BUN 10; Creatinine, Ser 1.02; Hemoglobin 13.0; Platelets 343; Potassium 3.7; Sodium 140   Recent Lipid Panel No results found for: "CHOL", "TRIG", "HDL", "CHOLHDL", "VLDL", "LDLCALC", "LDLDIRECT"  Physical Exam:   VS:  BP 110/70   Pulse 99   Ht 5\' 2"  (1.575 m)   Wt 177 lb 9.6 oz (80.6 kg)   LMP  (LMP Unknown)   SpO2 98%   BMI 32.48 kg/m    Wt Readings from Last 3 Encounters:  02/21/23 177 lb 9.6 oz (80.6 kg)  07/15/15 175 lb (79.4 kg)  03/27/11 161 lb (73 kg)    General: Well nourished, well developed, in no acute distress Head: Atraumatic, normal size  Eyes: PEERLA, EOMI  Neck: Supple, no JVD Endocrine: No thryomegaly Cardiac: Normal S1, S2; RRR; no murmurs, rubs, or gallops Lungs: Clear to auscultation bilaterally, no wheezing, rhonchi or rales  Abd: Soft, nontender, no hepatomegaly  Ext: No edema, pulses 2+ Musculoskeletal: No deformities, BUE and BLE strength normal and equal Skin: Warm and dry, no rashes   Neuro: Alert and oriented to person, place, time, and situation, CNII-XII grossly intact, no focal deficits  Psych: Normal mood and affect   ASSESSMENT:   [name] is a 32 y.o. female who presents for the following: 1. Palpitations   2. SOB (shortness of breath) on exertion   3. Precordial pain   4. Tobacco abuse     PLAN:   1. Palpitations 2. SOB (shortness of breath) on exertion 3. Precordial pain -Describes episodes of palpitations that were actually worse in June.  Symptoms have improved.  Chest pain  is noncardiac.  No further chest pain episodes.  EKG is normal.  Symptoms could represent arrhythmia.  Needs a TSH.  We will proceed with a 14-day ZIO to exclude any arrhythmias.  Her exam is quite normal on exam today.  Does not appear to be stress related.  She is drinking plenty of water.  She will see me back as needed based on results of testing.  4. Tobacco abuse -3 minutes of smoking cessation counseling was provided in office today.  She needs to quit smoking.  Disposition: Return if symptoms worsen or fail  to improve.  Medication Adjustments/Labs and Tests Ordered: Current medicines are reviewed at length with the patient today.  Concerns regarding medicines are outlined above.  Orders Placed This Encounter  Procedures   TSH   LONG TERM MONITOR (3-14 DAYS)   EKG 12-Lead   No orders of the defined types were placed in this encounter.  Patient Instructions  Medication Instructions:  No changes    *If you need a refill on your cardiac medications before your next appointment, please call your pharmacy*   Lab Work:  TSH     Testing/Procedures: Your physician has recommended that you wear a holter monitor  Zio  14 days. Holter monitors are medical devices that record the heart's electrical activity. Doctors most often use these monitors to diagnose arrhythmias. Arrhythmias are problems with the speed or rhythm of the heartbeat. The monitor is a small, portable device. You can wear one while you do your normal daily activities. This is usually used to diagnose what is causing palpitations/syncope (passing out).    Follow-Up: At [facility], you and your health needs are our priority.  As part of our continuing mission to provide you with exceptional heart care, we have created designated Provider Care Teams.  These Care Teams include your primary Cardiologist (physician) and Advanced Practice Providers (APPs -  Physician Assistants and Nurse Practitioners) who all work  together to provide you with the care you need, when you need it.  We recommend signing up for the patient portal called "MyChart".  Sign up information is provided on this After Visit Summary.  MyChart is used to connect with patients for Virtual Visits (Telemedicine).  Patients are able to view lab/test results, encounter notes, upcoming appointments, etc.  Non-urgent messages can be sent to your provider as well.   To learn more about what you can do with MyChart, go to [email or web address].    Your next appointment:   as  needed  The format for your next appointment:   In Person  Provider:   Reatha Harps, MD    Other Instructions  ZIO XT- Long Term Monitor Instructions  Your physician has requested you wear a ZIO patch monitor for 14 days.  This is a single patch monitor. [employer] supplies one patch monitor per enrollment. Additional stickers are not available. Please do not apply patch if you will be having a Nuclear Stress Test,  Echocardiogram, Cardiac CT, MRI, or Chest Xray during the period you would be wearing the  monitor. The patch cannot be worn during these tests. You cannot remove and re-apply the  ZIO XT patch monitor.  Your ZIO patch monitor will be mailed 3 day USPS to your address on file. It may take 3-5 days  to receive your monitor after you have been enrolled.  Once you have received your monitor, please review the enclosed instructions. Your monitor  has already been registered assigning a specific monitor serial # to you.  Billing and Patient Assistance Program Information  We have supplied [employer] with any of your insurance information on file for billing purposes. [employer] offers a sliding scale Patient Assistance Program for patients that do not have  insurance, or whose insurance does not completely cover the cost of the ZIO monitor.  You must apply for the Patient Assistance Program to qualify for this discounted rate.  To apply, please call  [employer] at [phone], select option 4, select option 2, ask to apply for  Patient Assistance Program. Meredeth Ide will ask your  household income, and how many people  are in your household. They will quote your out-of-pocket cost based on that information.  [employer] will also be able to set up a 17-month, interest-free payment plan if needed.  Applying the monitor   Shave hair from upper left chest.  Hold abrader disc by orange tab. Rub abrader in 40 strokes over the upper left chest as  indicated in your monitor instructions.  Clean area with 4 enclosed alcohol pads. Let dry.  Apply patch as indicated in monitor instructions. Patch will be placed under collarbone on left  side of chest with arrow pointing upward.  Rub patch adhesive wings for 2 minutes. Remove white label marked "1". Remove the white  label marked "2". Rub patch adhesive wings for 2 additional minutes.  While looking in a mirror, press and release button in center of patch. A small green light will  flash 3-4 times. This will be your only indicator that the monitor has been turned on.  Do not shower for the first 24 hours. You may shower after the first 24 hours.  Press the button if you feel a symptom. You will hear a small click. Record Date, Time and  Symptom in the Patient Logbook.  When you are ready to remove the patch, follow instructions on the last 2 pages of Patient  Logbook. Stick patch monitor onto the last page of Patient Logbook.  Place Patient Logbook in the blue and white box. Use locking tab on box and tape box closed  securely. The blue and white box has prepaid postage on it. Please place it in the mailbox as  soon as possible. Your physician should have your test results approximately 7 days after the  monitor has been mailed back to [facility].  Call [facility] Customer Care at [phone] if you have questions regarding  your ZIO XT patch monitor. Call them immediately if you see an orange  light blinking on your  monitor.  If your monitor falls off in less than 4 days, contact our Monitor department at [phone].  If your monitor becomes loose or falls off after 4 days call [employer] at [phone] for  suggestions on securing your monitor     Signed, Lenna Gilford. Flora Lipps, MD, [facility]  [facility]  [address], [address] [place], [place] [address] [phone]  02/21/2023 3:10 PM

## 2023-02-21 NOTE — Progress Notes (Unsigned)
Enrolled for [employer] to mail a ZIO XT long term holter monitor to the patients address on file.  

## 2023-02-21 NOTE — Patient Instructions (Signed)
Medication Instructions:  No changes    *If you need a refill on your cardiac medications before your next appointment, please call your pharmacy*   Lab Work:  TSH     Testing/Procedures: Your physician has recommended that you wear a holter monitor  Zio  14 days. Holter monitors are medical devices that record the heart's electrical activity. Doctors most often use these monitors to diagnose arrhythmias. Arrhythmias are problems with the speed or rhythm of the heartbeat. The monitor is a small, portable device. You can wear one while you do your normal daily activities. This is usually used to diagnose what is causing palpitations/syncope (passing out).    Follow-Up: At [facility], you and your health needs are our priority.  As part of our continuing mission to provide you with exceptional heart care, we have created designated Provider Care Teams.  These Care Teams include your primary Cardiologist (physician) and Advanced Practice Providers (APPs -  Physician Assistants and Nurse Practitioners) who all work together to provide you with the care you need, when you need it.  We recommend signing up for the patient portal called "MyChart".  Sign up information is provided on this After Visit Summary.  MyChart is used to connect with patients for Virtual Visits (Telemedicine).  Patients are able to view lab/test results, encounter notes, upcoming appointments, etc.  Non-urgent messages can be sent to your provider as well.   To learn more about what you can do with MyChart, go to [email or web address].    Your next appointment:   as  needed  The format for your next appointment:   In Person  Provider:   Reatha Harps, MD    Other Instructions  ZIO XT- Long Term Monitor Instructions  Your physician has requested you wear a ZIO patch monitor for 14 days.  This is a single patch monitor. [employer] supplies one patch monitor per enrollment. Additional stickers are not  available. Please do not apply patch if you will be having a Nuclear Stress Test,  Echocardiogram, Cardiac CT, MRI, or Chest Xray during the period you would be wearing the  monitor. The patch cannot be worn during these tests. You cannot remove and re-apply the  ZIO XT patch monitor.  Your ZIO patch monitor will be mailed 3 day USPS to your address on file. It may take 3-5 days  to receive your monitor after you have been enrolled.  Once you have received your monitor, please review the enclosed instructions. Your monitor  has already been registered assigning a specific monitor serial # to you.  Billing and Patient Assistance Program Information  We have supplied [employer] with any of your insurance information on file for billing purposes. [employer] offers a sliding scale Patient Assistance Program for patients that do not have  insurance, or whose insurance does not completely cover the cost of the ZIO monitor.  You must apply for the Patient Assistance Program to qualify for this discounted rate.  To apply, please call [employer] at [phone], select option 4, select option 2, ask to apply for  Patient Assistance Program. Meredeth Ide will ask your household income, and how many people  are in your household. They will quote your out-of-pocket cost based on that information.  [employer] will also be able to set up a 71-month, interest-free payment plan if needed.  Applying the monitor   Shave hair from upper left chest.  Hold abrader disc by orange tab. Rub abrader in 40 strokes over the upper  left chest as  indicated in your monitor instructions.  Clean area with 4 enclosed alcohol pads. Let dry.  Apply patch as indicated in monitor instructions. Patch will be placed under collarbone on left  side of chest with arrow pointing upward.  Rub patch adhesive wings for 2 minutes. Remove white label marked "1". Remove the white  label marked "2". Rub patch adhesive wings for 2 additional minutes.   While looking in a mirror, press and release button in center of patch. A small green light will  flash 3-4 times. This will be your only indicator that the monitor has been turned on.  Do not shower for the first 24 hours. You may shower after the first 24 hours.  Press the button if you feel a symptom. You will hear a small click. Record Date, Time and  Symptom in the Patient Logbook.  When you are ready to remove the patch, follow instructions on the last 2 pages of Patient  Logbook. Stick patch monitor onto the last page of Patient Logbook.  Place Patient Logbook in the blue and white box. Use locking tab on box and tape box closed  securely. The blue and white box has prepaid postage on it. Please place it in the mailbox as  soon as possible. Your physician should have your test results approximately 7 days after the  monitor has been mailed back to [facility].  Call [facility] Customer Care at [phone] if you have questions regarding  your ZIO XT patch monitor. Call them immediately if you see an orange light blinking on your  monitor.  If your monitor falls off in less than 4 days, contact our Monitor department at [phone].  If your monitor becomes loose or falls off after 4 days call [employer] at [phone] for  suggestions on securing your monitor

## 2023-02-22 ENCOUNTER — Telehealth: Payer: Self-pay | Admitting: *Deleted

## 2023-02-22 NOTE — Telephone Encounter (Signed)
-----   Message from Reatha Harps sent at 02/22/2023  8:02 AM EDT ----- Normal TSH. My chart. -W

## 2023-02-22 NOTE — Telephone Encounter (Signed)
Left detail message of results  on voicemail per DPR , any question may call back

## 2023-02-26 DIAGNOSIS — R002 Palpitations: Secondary | ICD-10-CM | POA: Diagnosis not present

## 2024-05-06 ENCOUNTER — Emergency Department (HOSPITAL_COMMUNITY)
Admission: EM | Admit: 2024-05-06 | Discharge: 2024-05-06 | Discharge status: Against Medical Advice | Payer: PRIVATE HEALTH INSURANCE | Attending: Emergency Medicine | Admitting: Emergency Medicine

## 2024-05-06 ENCOUNTER — Other Ambulatory Visit: Payer: Self-pay

## 2024-05-06 ENCOUNTER — Emergency Department (HOSPITAL_COMMUNITY): Payer: PRIVATE HEALTH INSURANCE

## 2024-05-06 ENCOUNTER — Encounter (HOSPITAL_COMMUNITY): Payer: Self-pay | Admitting: Emergency Medicine

## 2024-05-06 DIAGNOSIS — R202 Paresthesia of skin: Secondary | ICD-10-CM | POA: Insufficient documentation

## 2024-05-06 DIAGNOSIS — R0602 Shortness of breath: Secondary | ICD-10-CM | POA: Insufficient documentation

## 2024-05-06 DIAGNOSIS — R251 Tremor, unspecified: Secondary | ICD-10-CM | POA: Insufficient documentation

## 2024-05-06 DIAGNOSIS — R0789 Other chest pain: Secondary | ICD-10-CM | POA: Insufficient documentation

## 2024-05-06 DIAGNOSIS — Z5321 Procedure and treatment not carried out due to patient leaving prior to being seen by health care provider: Secondary | ICD-10-CM | POA: Diagnosis not present

## 2024-05-06 DIAGNOSIS — R079 Chest pain, unspecified: Secondary | ICD-10-CM | POA: Diagnosis present

## 2024-05-06 LAB — CBC
HCT: 40.2 % (ref 36.0–46.0)
Hemoglobin: 13.7 g/dL (ref 12.0–15.0)
MCH: 34.1 pg — ABNORMAL HIGH (ref 26.0–34.0)
MCHC: 34.1 g/dL (ref 30.0–36.0)
MCV: 100 fL (ref 80.0–100.0)
Platelets: 407 K/uL — ABNORMAL HIGH (ref 150–400)
RBC: 4.02 MIL/uL (ref 3.87–5.11)
RDW: 11.8 % (ref 11.5–15.5)
WBC: 6.1 K/uL (ref 4.0–10.5)
nRBC: 0 % (ref 0.0–0.2)

## 2024-05-06 LAB — BASIC METABOLIC PANEL WITH GFR
Anion gap: 14 (ref 5–15)
BUN: 10 mg/dL (ref 6–20)
CO2: 20 mmol/L — ABNORMAL LOW (ref 22–32)
Calcium: 9.4 mg/dL (ref 8.9–10.3)
Chloride: 103 mmol/L (ref 98–111)
Creatinine, Ser: 0.99 mg/dL (ref 0.44–1.00)
GFR, Estimated: >60 mL/min (ref >60)
Glucose, Bld: 107 mg/dL — ABNORMAL HIGH (ref 70–99)
Potassium: 3.5 mmol/L (ref 3.5–5.1)
Sodium: 137 mmol/L (ref 135–145)

## 2024-05-06 LAB — HCG, SERUM, QUALITATIVE: Preg, Serum: NEGATIVE

## 2024-05-06 LAB — TROPONIN I (HIGH SENSITIVITY)
Troponin I (High Sensitivity): <2 ng/L (ref <18)
Troponin I (High Sensitivity): <2 ng/L (ref <18)

## 2024-05-06 LAB — MAGNESIUM: Magnesium: 1.6 mg/dL — ABNORMAL LOW (ref 1.7–2.4)

## 2024-05-06 NOTE — ED Triage Notes (Signed)
 Pt c/o chest pain, SHOB and tingling in her arms. 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

## 2024-05-06 NOTE — ED Triage Notes (Signed)
 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

## 2024-05-06 NOTE — ED Provider Triage Note (Signed)
 Emergency Medicine Provider Triage Evaluation Note  [name] , a 33 y.o. female  was evaluated in triage.  Pt complains of episode of chest pain that lasted for about 10 minutes with associated tremor in her hands.  She was at the clients house when this occurred.  Still having the tremors.  Has had history of palpitations and was worked up by cardiology with no significant finding according to her.  Not on OCP.  No history of DVT or PE.  Denies any significant medical history not on any medications currently.  Review of Systems  Positive: As above Negative: As above  Physical Exam  BP (!) 165/96 (BP Location: Right Arm)   Pulse (!) 101   Temp 98.5 F (36.9 C)   Resp (!) 22   SpO2 100%  Gen:   Awake, no distress   Resp:  Normal effort  MSK:   Moves extremities without difficulty  Other:    Medical Decision Making  Medically screening exam initiated at 12:55 PM.  Appropriate orders placed.  [name] was informed that the remainder of the evaluation will be completed by another provider, this initial triage assessment does not replace that evaluation, and the importance of remaining in the ED until their evaluation is complete.    Sara Loge, PA-C 05/06/24 [id number]

## 2024-05-06 NOTE — ED Notes (Signed)
Patient left due to long wait.

## 2024-10-07 ENCOUNTER — Ambulatory Visit: Payer: PRIVATE HEALTH INSURANCE | Admitting: Family Medicine
